# Patient Record
Sex: Female | Born: 1995 | Race: Black or African American | Hispanic: No | Marital: Single | Smoking: Never smoker
Health system: Southern US, Community
[De-identification: ages and names within clinical notes are randomized; demographics above are authoritative.]

## PROBLEM LIST (undated history)

## (undated) DIAGNOSIS — Z789 Other specified health status: Secondary | ICD-10-CM

## (undated) DIAGNOSIS — Z9889 Other specified postprocedural states: Secondary | ICD-10-CM

---
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
## 2017-04-21 DIAGNOSIS — N750 Cyst of Bartholin's gland: Secondary | ICD-10-CM | POA: Insufficient documentation

## 2017-09-01 ENCOUNTER — Emergency Department: Payer: BLUE CROSS/BLUE SHIELD

## 2017-09-01 ENCOUNTER — Emergency Department
Admission: EM | Admit: 2017-09-01 | Discharge: 2017-09-02 | Disposition: A | Discharge status: Home / Self Care | Payer: BLUE CROSS/BLUE SHIELD | Attending: Emergency Medicine | Admitting: Emergency Medicine

## 2017-09-01 ENCOUNTER — Encounter: Payer: Self-pay | Admitting: Emergency Medicine

## 2017-09-01 DIAGNOSIS — G5600 Carpal tunnel syndrome, unspecified upper limb: Secondary | ICD-10-CM

## 2017-09-01 DIAGNOSIS — G5601 Carpal tunnel syndrome, right upper limb: Secondary | ICD-10-CM | POA: Diagnosis not present

## 2017-09-01 DIAGNOSIS — M25531 Pain in right wrist: Secondary | ICD-10-CM | POA: Diagnosis present

## 2017-09-01 NOTE — ED Triage Notes (Signed)
Pt has right wrist pain for 2 weeks.  Pt states WC  Pt states she does repetittive use of right wrist at work.   No swelling noted.

## 2017-09-02 MED ORDER — IBUPROFEN 800 MG PO TABS: 800.0000 mg | ORAL_TABLET | Freq: Three times a day (TID) | ORAL | 0 refills | Status: DC | PRN

## 2017-09-02 NOTE — ED Notes (Signed)
Breath Analysis completed. Paperwork on chart.

## 2017-09-02 NOTE — ED Provider Notes (Signed)
[facility] Emergency Department Provider Note  time seen: 11:45 PM  I have reviewed the triage vital signs and the nursing notes.   HISTORY  Chief Complaint Wrist Pain    HPI FREDI HURTADO is a 21 y.o. female presents with nontraumatic right wrist pain2 weeks. Patient states that she does repetitive movements at work with her right wrist and believes this may be the reason for her discomfort. Patient states that the pain radiates into her hand from the thumb to the ring finger on the palmar aspect.Milus Mallick denies any neck injury.   Past medical history none There are no active problems to display for this patient.   past surgical history none  Prior to Admission medications   Not on File    Allergies no known drug allergies No family history on file.  Social History Social History  Substance Use Topics  . Smoking status: Not on file  . Smokeless tobacco: Not on file  . Alcohol use Not on file    Review of Systems Constitutional: No fever/chills Eyes: No visual changes. ENT: No sore throat. Cardiovascular: Denies chest pain. Respiratory: Denies shortness of breath. Gastrointestinal: No abdominal pain.  No nausea, no vomiting.  No diarrhea.  No constipation. Genitourinary: Negative for dysuria. Musculoskeletal: Negative for neck pain.  Negative for back pain.positive for right wrist pain Integumentary: Negative for rash. Neurological: Negative for headaches, focal weakness or numbness.   ____________________________________________   PHYSICAL EXAM:  VITAL SIGNS: ED Triage Vitals  Enc Vitals Group     BP 09/01/17 2239 139/75     Pulse Rate 09/01/17 2239 68     Resp 09/01/17 2239 18     Temp 09/01/17 2239 98.3 F (36.8 C)     Temp Source 09/01/17 2239 Oral     SpO2 09/01/17 2239 100 %     Weight 09/01/17 2240 69.4 kg (153 lb)     Height 09/01/17 2240 1.676 m ( )     Head Circumference --      Peak Flow --    Pain Score 09/01/17 2238 8     Pain Loc --      Pain Edu? --      Excl. in GC? --     Constitutional: Alert and oriented. Well appearing and in no acute distress. Eyes: Conjunctivae are normal. Head: Atraumatic. Mouth/Throat: Mucous membranes are moist. Oropharynx non-erythematous. Neck: No stridor.  Cardiovascular: Normal rate, regular rhythm. Good peripheral circulation. Grossly normal heart sounds. Respiratory: Normal respiratory effort.  No retractions. Lungs CTAB. Gastrointestinal: Soft and nontender. No distention.  Musculoskeletal: No lower extremity tenderness nor edema. No gross deformities of extremities. Neurologic:  Normal speech and language. No gross focal neurologic deficits are appreciated.  Skin:  Skin is warm, dry and intact. No rash noted. Psychiatric: Mood and affect are normal. Speech and behavior are normal.   RADIOLOGY I, Mora N Cloud Graham, personally viewed and evaluated these images (plain radiographs) as part of my medical decision making, as well as reviewing the written report by the radiologist.  Dg Wrist Complete Right  Result Date: 09/01/2017 CLINICAL DATA:  RIGHT wrist pain for 2 weeks. Possible repetitive stress injury. EXAM: RIGHT WRIST - COMPLETE 3+ VIEW COMPARISON:  None. FINDINGS: There is no evidence of fracture or dislocation. There is no evidence of arthropathy or other focal bone abnormality. Soft tissues are unremarkable. IMPRESSION: Negative. Electronically Signed   By: Awilda Metro M.D.   On: 09/01/2017 23:11  Procedures   ____________________________________________   INITIAL IMPRESSION / ASSESSMENT AND PLAN / ED COURSE  Pertinent labs & imaging results that were available during my care of the patient were reviewed by me and considered in my medical decision making (see chart for details).  history of physical exam concerning for possible median nerve compression and a such wrist splint applied. Patient given ibuprofen 800  mg will be prescribed same for home. Patient be referred to orthopedic surgery       ____________________________________________  FINAL CLINICAL IMPRESSION(S) / ED DIAGNOSES  Final diagnoses:  Median nerve compression     MEDICATIONS GIVEN DURING THIS VISIT:  Medications - No data to display   NEW OUTPATIENT MEDICATIONS STARTED DURING THIS VISIT:  New Prescriptions   No medications on file    Modified Medications   No medications on file    Discontinued Medications   No medications on file     Note:  This document was prepared using Dragon voice recognition software and may include unintentional dictation errors.    Darci CurrentBrown, [place] N, MD 09/02/17 [phone]

## 2017-09-02 NOTE — ED Notes (Signed)

## 2017-09-02 NOTE — ED Notes (Addendum)
Awaiting workers comp specs to be completed prior to d/c by available/certified tech

## 2017-09-02 NOTE — ED Notes (Signed)
ED Provider at bedside. 

## 2017-09-02 NOTE — ED Notes (Signed)
esig pad not working. Paper copy

## 2018-01-23 ENCOUNTER — Ambulatory Visit (HOSPITAL_COMMUNITY)
Admission: EM | Admit: 2018-01-23 | Discharge: 2018-01-23 | Disposition: A | Discharge status: Home / Self Care | Payer: Managed Care, Other (non HMO) | Attending: Internal Medicine | Admitting: Internal Medicine

## 2018-01-23 ENCOUNTER — Encounter (HOSPITAL_COMMUNITY): Payer: Self-pay | Admitting: Emergency Medicine

## 2018-01-23 DIAGNOSIS — L0291 Cutaneous abscess, unspecified: Secondary | ICD-10-CM | POA: Diagnosis not present

## 2018-01-23 MED ORDER — CEPHALEXIN 500 MG PO CAPS: 500.0000 mg | ORAL_CAPSULE | Freq: Four times a day (QID) | ORAL | 0 refills | Status: DC

## 2018-01-23 NOTE — ED Triage Notes (Signed)
PT C/O: abscess on vagina onset 3-4 days associated w/pain and swelling, drainage  Reports she tried to pop it w/her fingers.   DENIES: fevers  TAKING MEDS: Warm sitz baths  A&O x4... NAD... Ambulatory

## 2018-01-23 NOTE — ED Provider Notes (Signed)
[facility]    CSN: [id number] Arrival date & time: 01/23/18  1522     History   Chief Complaint Chief Complaint  Patient presents with  . Abscess    HPI Debra Cline is a 22 y.o. female.   22 year old female comes in for 3-4 day history of abscess on the vagina. States she used to get them very often, but has not had one for a year or two.  She has noticed increased swelling with some drainage.  Denies fever, chills, night sweats.  Has been doing warm sitz baths.  States her OB/GYN is not able to fit her in till May.       History reviewed. No pertinent past medical history.  There are no active problems to display for this patient.   History reviewed. No pertinent surgical history.  OB History    No data available       Home Medications    Prior to Admission medications   Medication Sig Start Date End Date Taking? Authorizing Provider  cephALEXin (KEFLEX) 500 MG capsule Take 1 capsule (500 mg total) by mouth 4 (four) times daily. 01/23/18   Cathie HoopsYu, Valyn Latchford V, PA-C  ibuprofen (ADVIL,MOTRIN) 800 MG tablet Take 1 tablet (800 mg total) by mouth every 8 (eight) hours as needed. 09/02/17   Darci CurrentBrown,  N, MD    Family History Family History  Family history unknown: Yes    Social History Social History   Tobacco Use  . Smoking status: Never Smoker  . Smokeless tobacco: Never Used  Substance Use Topics  . Alcohol use: Not on file  . Drug use: Not on file     Allergies   Mold extract [trichophyton] and Peanut-containing drug products   Review of Systems Review of Systems  Reason unable to perform ROS: See HPI as above.     Physical Exam Triage Vital Signs ED Triage Vitals  Enc Vitals Group     BP 01/23/18 1534 119/61     Pulse Rate 01/23/18 1534 66     Resp 01/23/18 1534 16     Temp 01/23/18 1534 98.5 F (36.9 C)     Temp Source 01/23/18 1534 Oral     SpO2 01/23/18 1534 100 %     Weight --      Height --      Head Circumference --       Peak Flow --      Pain Score 01/23/18 1535 6     Pain Loc --      Pain Edu? --      Excl. in GC? --    No data found.  Updated Vital Signs BP 119/61 (BP Location: Left Arm)   Pulse 66   Temp 98.5 F (36.9 C) (Oral)   Resp 16   SpO2 100%   Physical Exam  Constitutional: She is oriented to person, place, and time. She appears well-developed and well-nourished. No distress.  HENT:  Head: Normocephalic and atraumatic.  Eyes: Conjunctivae are normal. Pupils are equal, round, and reactive to light.  Genitourinary:  Genitourinary Comments: Bartholin gland abscess to the left that is self draining. Tenderness to palpation.   Neurological: She is alert and oriented to person, place, and time.    UC Treatments / Results  Labs (all labs ordered are listed, but only abnormal results are displayed) Labs Reviewed - No data to display  EKG  EKG Interpretation None  Radiology No results found.  Procedures Procedures (including critical care time)  Medications Ordered in UC Medications - No data to display   Initial Impression / Assessment and Plan / UC Course  I have reviewed the triage vital signs and the nursing notes.  Pertinent labs & imaging results that were available during my care of the patient were reviewed by me and considered in my medical decision making (see chart for details).    Given self drainage, will defer I&D for now.  Keflex as directed.  Continue warm compresses and sitz bath.  Follow-up with OB/GYN/PCP for further evaluation needed.  Information on woman's outpatient given as well.  Return precautions given.  Patient expresses understanding and agrees to plan.  Final Clinical Impressions(s) / UC Diagnoses   Final diagnoses:  Abscess    ED Discharge Orders        Ordered    cephALEXin (KEFLEX) 500 MG capsule  4 times daily     01/23/18 1649        Belinda Fisher, [place] 01/23/18 1655

## 2018-01-23 NOTE — Discharge Instructions (Signed)
Abscess is self draining. Will start keflex for now. Continue warm compresses. If OBGYN still unable to fit you in, follow up with PCP for recheck and possible drainage. I have also attached our OBGYN information in case they can fit you in sooner.

## 2018-03-10 ENCOUNTER — Emergency Department
Admission: EM | Admit: 2018-03-10 | Discharge: 2018-03-10 | Disposition: A | Discharge status: Home / Self Care | Payer: Managed Care, Other (non HMO) | Attending: Emergency Medicine | Admitting: Emergency Medicine

## 2018-03-10 ENCOUNTER — Emergency Department: Payer: Managed Care, Other (non HMO)

## 2018-03-10 ENCOUNTER — Other Ambulatory Visit: Payer: Self-pay

## 2018-03-10 DIAGNOSIS — R002 Palpitations: Secondary | ICD-10-CM | POA: Insufficient documentation

## 2018-03-10 DIAGNOSIS — R0789 Other chest pain: Secondary | ICD-10-CM | POA: Insufficient documentation

## 2018-03-10 DIAGNOSIS — E876 Hypokalemia: Secondary | ICD-10-CM | POA: Diagnosis not present

## 2018-03-10 DIAGNOSIS — R079 Chest pain, unspecified: Secondary | ICD-10-CM

## 2018-03-10 LAB — CBC
HCT: 39.8 % (ref 35.0–47.0)
Hemoglobin: 12.8 g/dL (ref 12.0–16.0)
MCH: 23.2 pg — ABNORMAL LOW (ref 26.0–34.0)
MCHC: 32.2 g/dL (ref 32.0–36.0)
MCV: 72 fL — ABNORMAL LOW (ref 80.0–100.0)
Platelets: 260 10*3/uL (ref 150–440)
RBC: 5.52 MIL/uL — ABNORMAL HIGH (ref 3.80–5.20)
RDW: 14 % (ref 11.5–14.5)
WBC: 7 10*3/uL (ref 3.6–11.0)

## 2018-03-10 LAB — BASIC METABOLIC PANEL
Anion gap: 8 (ref 5–15)
BUN: 10 mg/dL (ref 6–20)
CO2: 26 mmol/L (ref 22–32)
Calcium: 9.2 mg/dL (ref 8.9–10.3)
Chloride: 107 mmol/L (ref 101–111)
Creatinine, Ser: 0.71 mg/dL (ref 0.44–1.00)
GFR calc Af Amer: >60 mL/min (ref >60)
GFR calc non Af Amer: >60 mL/min (ref >60)
Glucose, Bld: 101 mg/dL — ABNORMAL HIGH (ref 65–99)
Potassium: 3.1 mmol/L — ABNORMAL LOW (ref 3.5–5.1)
Sodium: 141 mmol/L (ref 135–145)

## 2018-03-10 LAB — TROPONIN I: Troponin I: <0.03 ng/mL (ref <0.03)

## 2018-03-10 MED ORDER — POTASSIUM CHLORIDE CRYS ER 20 MEQ PO TBCR
40.0000 meq | EXTENDED_RELEASE_TABLET | Freq: Once | ORAL | Status: AC
  Administered 2018-03-10: 40 meq via ORAL

## 2018-03-10 MED ORDER — POTASSIUM CHLORIDE CRYS ER 20 MEQ PO TBCR
EXTENDED_RELEASE_TABLET | ORAL | Status: AC
  Filled 2018-03-10: qty 2

## 2018-03-10 NOTE — ED Notes (Signed)
First nurse note   Presents with weakness and chest pain

## 2018-03-10 NOTE — ED Provider Notes (Signed)
[facility] Emergency Department Provider Note  ____________________________________________  Time seen: Approximately 10:14 PM  I have reviewed the triage vital signs and the nursing notes.   HISTORY  Chief Complaint Chest Pain    HPI Debra Cline is a 22 y.o. female with a history of atypical chest pain and palpitations, anxiety of her medications, presenting with chest pain.  The patient reports that last year she had an episode where she became "paralyzed," and was evaluated at the hospital and with multiple outpatient cardiology visits without any clear findings other than "palpitations."Patient reports that yesterday, she was carrying a box at work when she developed a tightness sensation in the center of the chest.  The pain did not radiate and she did not have any associated diaphoresis, nausea or vomiting, palpitations, lightheadedness or fainting.  This pain was similar to the pain she has experienced multiple times since her extensive evaluation last year.  At this time, the patient is asymptomatic.  She denies any new symptoms including cough or cold symptoms, fever chills, LE swelling or calf pain.   History reviewed. No pertinent past medical history.  There are no active problems to display for this patient.   History reviewed. No pertinent surgical history.  Current Outpatient Rx  . Order #: [id number] Class: Normal  . Order #: [id number] Class: Print    Allergies Mold extract [trichophyton] and Peanut-containing drug products  Family History  Family history unknown: Yes    Social History Social History   Tobacco Use  . Smoking status: Never Smoker  . Smokeless tobacco: Never Used  Substance Use Topics  . Alcohol use: No    Frequency: Never  . Drug use: No    Review of Systems Constitutional: No fever/chills. Eyes: No visual changes. ENT: No sore throat. No congestion or rhinorrhea. Cardiovascular: Positive chest pain. Denies  palpitations. Respiratory: Denies shortness of breath.  No cough. Gastrointestinal: No abdominal pain.  No nausea, no vomiting.  No diarrhea.  No constipation. Genitourinary: Negative for dysuria. Musculoskeletal: Negative for back pain.  No lower extremity swelling or calf pain. Skin: Negative for rash. Neurological: Negative for headaches. No focal numbness, tingling or weakness.     ____________________________________________   PHYSICAL EXAM:  VITAL SIGNS: ED Triage Vitals  Enc Vitals Group     BP 03/10/18 1848 (!) 145/93     Pulse Rate 03/10/18 1848 83     Resp 03/10/18 1848 16     Temp 03/10/18 1848 98.5 F (36.9 C)     Temp Source 03/10/18 1848 Oral     SpO2 03/10/18 1848 100 %     Weight 03/10/18 1849 140 lb (63.5 kg)     Height 03/10/18 1849 5\' 6"  (1.676 m)     Head Circumference --      Peak Flow --      Pain Score 03/10/18 1852 8     Pain Loc --      Pain Edu? --      Excl. in GC? --     Constitutional: Alert and oriented. Well appearing and in no acute distress. Answers questions appropriately. Eyes: Conjunctivae are normal.  EOMI. No scleral icterus. Head: Atraumatic. Nose: No congestion/rhinnorhea. Mouth/Throat: Mucous membranes are moist.  Neck: No stridor.  Supple.  No JVD.  No meningismus. Cardiovascular: Normal rate, regular rhythm. No murmurs, rubs or gallops.  Respiratory: Normal respiratory effort.  No accessory muscle use or retractions. Lungs CTAB.  No wheezes, rales or ronchi. Gastrointestinal: Soft,  nontender and nondistended.  No guarding or rebound.  No peritoneal signs. Musculoskeletal: No LE edema. No ttp in the calves or palpable cords.  Negative Homan's sign. Neurologic:  A&Ox3.  Speech is clear.  Face and smile are symmetric.  EOMI.  Moves all extremities well. Skin:  Skin is warm, dry and intact. No rash noted. Psychiatric: Mood and affect are normal. Speech and behavior are normal.  Normal  judgement.  ____________________________________________   LABS (all labs ordered are listed, but only abnormal results are displayed)  Labs Reviewed  BASIC METABOLIC PANEL - Abnormal; Notable for the following components:      Result Value   Potassium 3.1 (*)    Glucose, Bld 101 (*)    All other components within normal limits  CBC - Abnormal; Notable for the following components:   RBC 5.52 (*)    MCV 72.0 (*)    MCH 23.2 (*)    All other components within normal limits  TROPONIN I  POC URINE PREG, ED   ____________________________________________  EKG  ED ECG REPORT I, Rockne MenghiniNorman, Anne-Caroline, the attending physician, personally viewed and interpreted this ECG.   Date: 03/10/2018  EKG Time: 1845  Rate: 85  Rhythm: normal sinus rhythm  Axis: normal  Intervals:none  ST&T Change: No STEMI  ____________________________________________  RADIOLOGY  Dg Chest 2 View  Result Date: 03/10/2018 CLINICAL DATA:  Left-sided chest pain EXAM: CHEST - 2 VIEW COMPARISON:  None. FINDINGS: The heart size and mediastinal contours are within normal limits. Both lungs are clear. The visualized skeletal structures are unremarkable. IMPRESSION: No active cardiopulmonary disease. Electronically Signed   By: Jasmine PangKim  Fujinaga M.D.   On: 03/10/2018 19:21    ____________________________________________   PROCEDURES  Procedure(s) performed: None  Procedures  Critical Care performed: No ____________________________________________   INITIAL IMPRESSION / ASSESSMENT AND PLAN / ED COURSE  Pertinent labs & imaging results that were available during my care of the patient were reviewed by me and considered in my medical decision making (see chart for details).  22 y.o. female presenting with chest pain that started yesterday and has now resolved.  Overall, the patient is hematin and medically stable and has no abnormal cardiopulmonary findings on my examination.  The etiology of her chest pain  is unclear but her workup in the emergency department is reassuring.  Her EKG does not show any ischemic changes and her troponin is negative.  Her chest x-ray does not show any acute cardiopulmonary process and she does not have any symptoms that would be concerning for infection.  A GI cause including esophageal spasm or reflux is possible but again this is less likely.  The patient has been off of her anxiety medications, self discontinued, for several months and some of the symptoms could be related to anxiety although she states she is not particularly stressed or feeling anxious.  At this time, the patient is safe for discharge and I have encouraged her to follow-up closely with her primary care physician.  Return precautions were discussed  ____________________________________________  FINAL CLINICAL IMPRESSION(S) / ED DIAGNOSES  Final diagnoses:  Nonspecific chest pain  Hypokalemia         NEW MEDICATIONS STARTED DURING THIS VISIT:  New Prescriptions   No medications on file      Rockne MenghiniNorman, Anne-Caroline, MD 03/10/18 2237

## 2018-03-10 NOTE — ED Triage Notes (Signed)
Pt states L sided CP that began yesterday around 4pm. States it's like a tightness. States she was walking when pain started. States when she bent down she would get dizzy. States she just kept getting worse. Denies cough or congestion.   Alert, oriented, ambulatory, no distress noted.

## 2018-03-10 NOTE — Discharge Instructions (Addendum)
These return to the emergency department if you develop severe chest pain, lightheadedness or fainting, shortness of breath, fever or chills, or any other symptoms concerning to you.  Please make a follow-up appointment with your primary care physician including to have your potassium rechecked.

## 2018-03-10 NOTE — ED Notes (Signed)
Pt ambulatory to POV without difficulty. VSS. NAD. Discharge instruction, and follow up discussed. All questions answered.

## 2019-12-09 ENCOUNTER — Emergency Department: Payer: Managed Care, Other (non HMO)

## 2019-12-09 ENCOUNTER — Encounter: Payer: Self-pay | Admitting: *Deleted

## 2019-12-09 ENCOUNTER — Other Ambulatory Visit: Payer: Self-pay

## 2019-12-09 DIAGNOSIS — Z91010 Allergy to peanuts: Secondary | ICD-10-CM | POA: Insufficient documentation

## 2019-12-09 DIAGNOSIS — R0789 Other chest pain: Secondary | ICD-10-CM | POA: Diagnosis not present

## 2019-12-09 LAB — CBC
HCT: 39.5 % (ref 36.0–46.0)
Hemoglobin: 12.6 g/dL (ref 12.0–15.0)
MCH: 24 pg — ABNORMAL LOW (ref 26.0–34.0)
MCHC: 31.9 g/dL (ref 30.0–36.0)
MCV: 75.1 fL — ABNORMAL LOW (ref 80.0–100.0)
Platelets: 300 10*3/uL (ref 150–400)
RBC: 5.26 MIL/uL — ABNORMAL HIGH (ref 3.87–5.11)
RDW: 13.2 % (ref 11.5–15.5)
WBC: 8.9 10*3/uL (ref 4.0–10.5)
nRBC: 0 % (ref 0.0–0.2)

## 2019-12-09 LAB — BASIC METABOLIC PANEL
Anion gap: 11 (ref 5–15)
BUN: 10 mg/dL (ref 6–20)
CO2: 22 mmol/L (ref 22–32)
Calcium: 9.1 mg/dL (ref 8.9–10.3)
Chloride: 104 mmol/L (ref 98–111)
Creatinine, Ser: 0.64 mg/dL (ref 0.44–1.00)
GFR calc Af Amer: >60 mL/min (ref >60)
GFR calc non Af Amer: >60 mL/min (ref >60)
Glucose, Bld: 102 mg/dL — ABNORMAL HIGH (ref 70–99)
Potassium: 4 mmol/L (ref 3.5–5.1)
Sodium: 137 mmol/L (ref 135–145)

## 2019-12-09 LAB — TROPONIN I (HIGH SENSITIVITY): Troponin I (High Sensitivity): <2 ng/L (ref <18)

## 2019-12-09 LAB — POCT PREGNANCY, URINE: Preg Test, Ur: NEGATIVE

## 2019-12-09 IMAGING — CR DG CHEST 2V
2 series · 2 of 2 positions shown · non-contrast
Comparison: Radiograph [DATE]

CLINICAL DATA: Chest pain

EXAM:
CHEST - 2 VIEW

[chest pa]
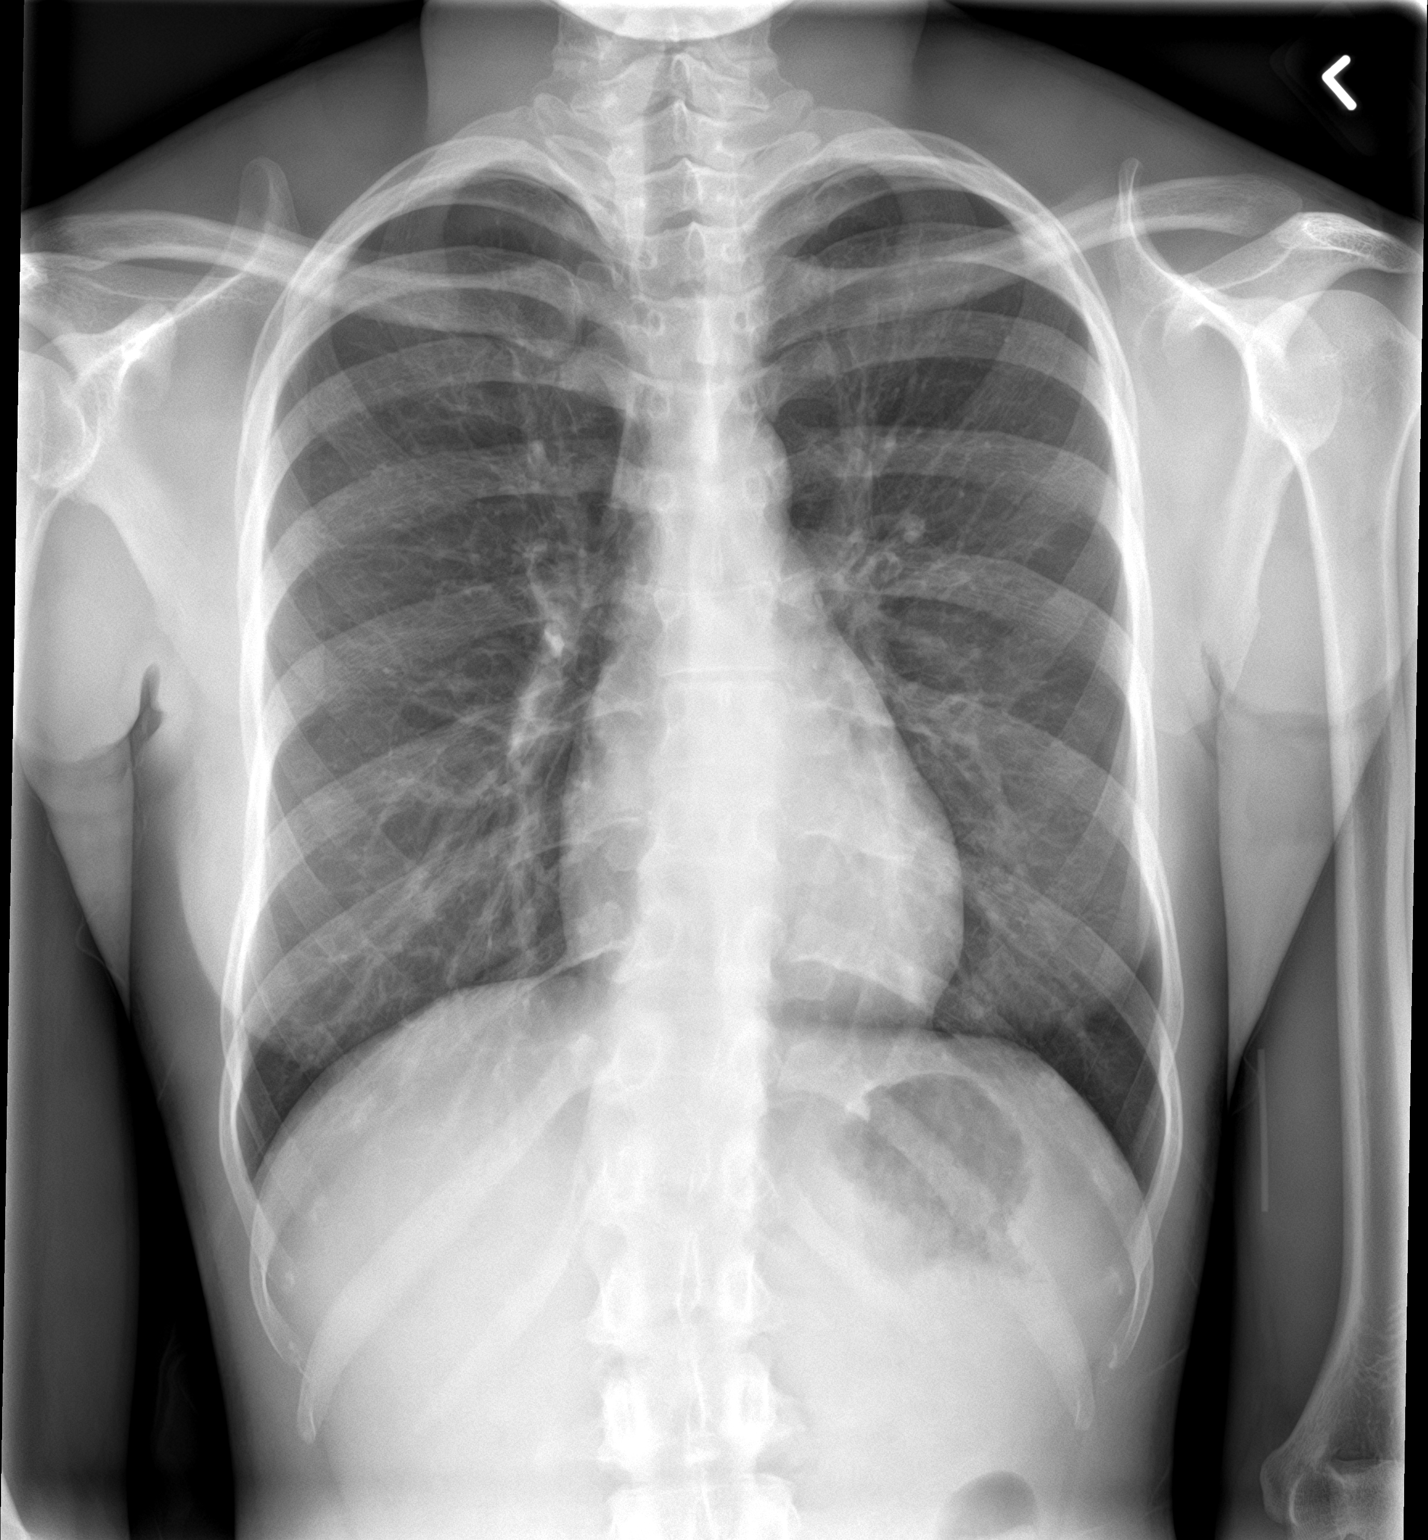

[chest lat]
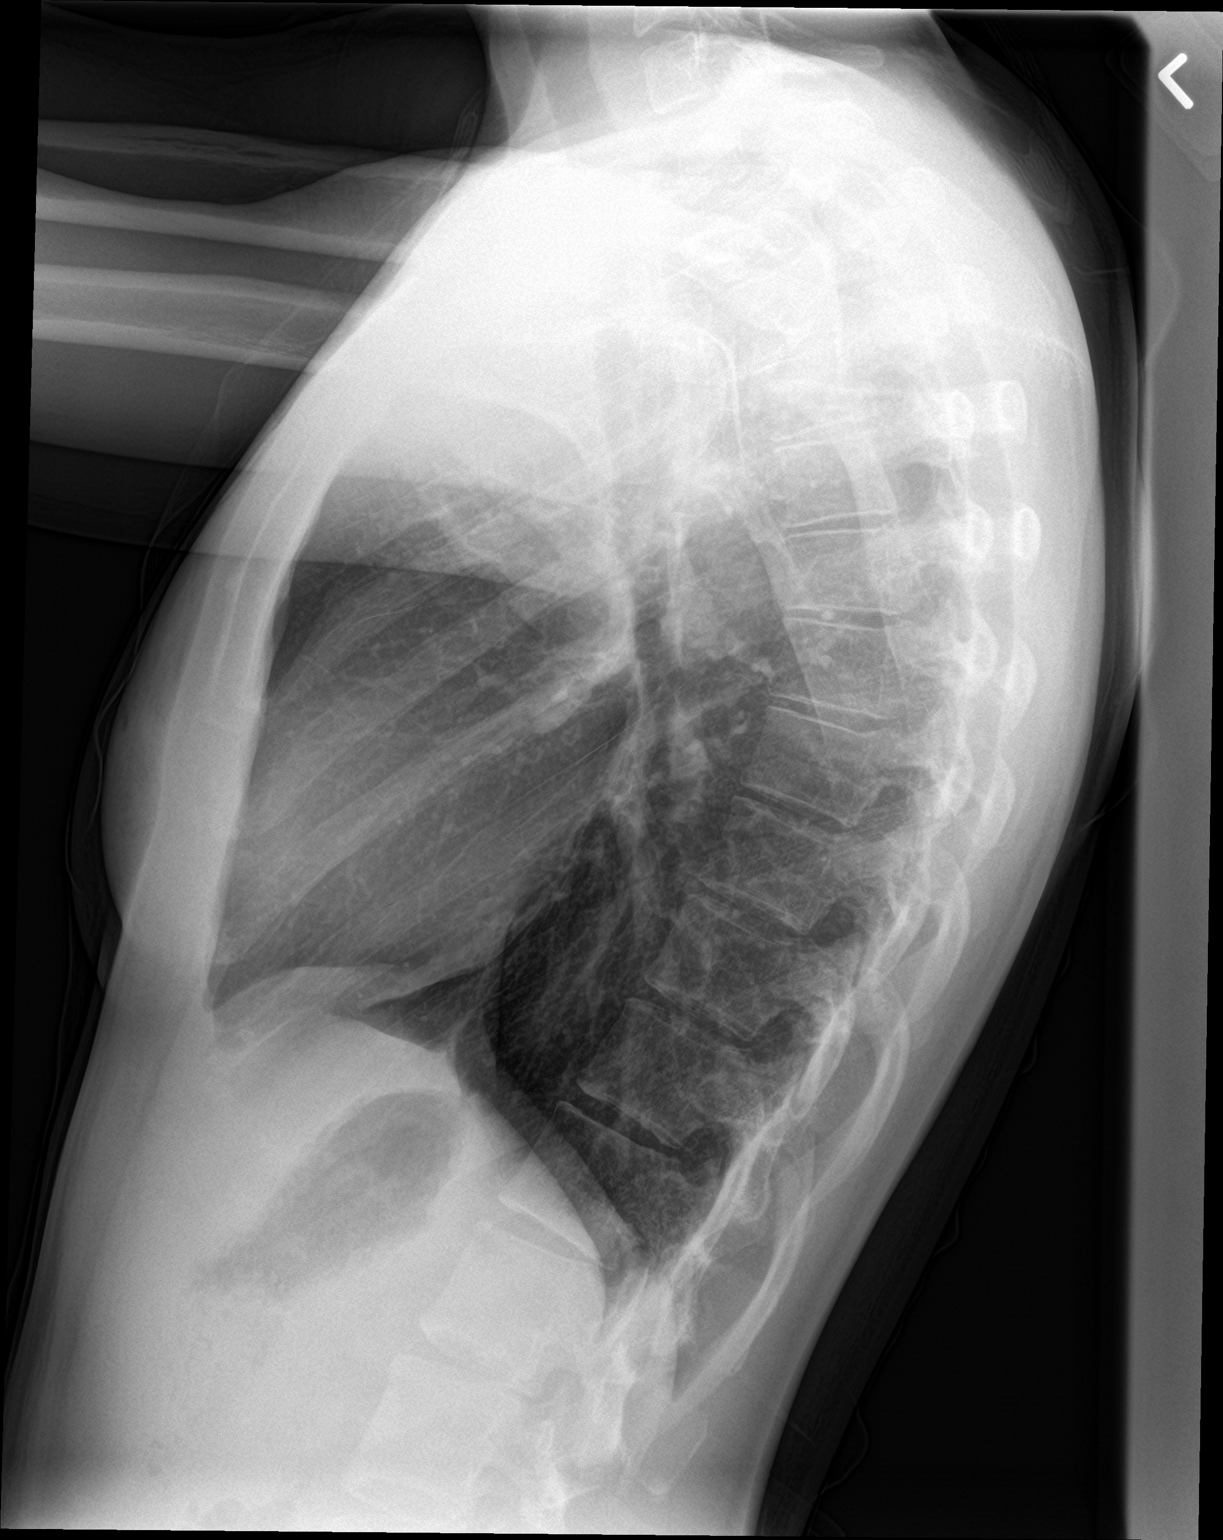

[2 of 2 positions shown; findings below may reference images not displayed]

FINDINGS: No consolidation, features of edema, pneumothorax, or effusion.
Pulmonary vascularity is normally distributed. The cardiomediastinal
contours are unremarkable. No acute osseous or soft tissue
abnormality.
IMPRESSION: No acute cardiopulmonary abnormality.

## 2019-12-09 MED ORDER — SODIUM CHLORIDE 0.9% FLUSH: 3.0000 mL | Freq: Once | INTRAVENOUS | Status: DC

## 2019-12-09 NOTE — ED Triage Notes (Signed)
Pt to triage via wheelchair.  Pt reports chest pain and left arm pain.  Sx since this am, worse this evening.  No cough.  No sob.  Pt reports nausea.  Pt alert  Speech clear.

## 2019-12-09 NOTE — ED Notes (Signed)
poct pregnancy Negative 

## 2019-12-10 ENCOUNTER — Emergency Department
Admission: EM | Admit: 2019-12-10 | Discharge: 2019-12-10 | Disposition: A | Discharge status: Home / Self Care | Payer: Managed Care, Other (non HMO) | Attending: Emergency Medicine | Admitting: Emergency Medicine

## 2019-12-10 ENCOUNTER — Emergency Department: Payer: Managed Care, Other (non HMO)

## 2019-12-10 DIAGNOSIS — R079 Chest pain, unspecified: Secondary | ICD-10-CM

## 2019-12-10 LAB — HEPATIC FUNCTION PANEL
ALT: 13 U/L (ref 0–44)
AST: 17 U/L (ref 15–41)
Albumin: 4.1 g/dL (ref 3.5–5.0)
Alkaline Phosphatase: 63 U/L (ref 38–126)
Bilirubin, Direct: 0.1 mg/dL (ref 0.0–0.2)
Indirect Bilirubin: 0.7 mg/dL (ref 0.3–0.9)
Total Bilirubin: 0.8 mg/dL (ref 0.3–1.2)
Total Protein: 7.1 g/dL (ref 6.5–8.1)

## 2019-12-10 LAB — LIPASE, BLOOD: Lipase: 24 U/L (ref 11–51)

## 2019-12-10 LAB — TROPONIN I (HIGH SENSITIVITY): Troponin I (High Sensitivity): <2 ng/L (ref <18)

## 2019-12-10 MED ORDER — KETOROLAC TROMETHAMINE 30 MG/ML IJ SOLN
10.0000 mg | Freq: Once | INTRAMUSCULAR | Status: AC
  Administered 2019-12-10: 9.9 mg via INTRAVENOUS
  Filled 2019-12-10: qty 1

## 2019-12-10 MED ORDER — IOHEXOL 350 MG/ML SOLN
75.0000 mL | Freq: Once | INTRAVENOUS | Status: AC | PRN
  Administered 2019-12-10: 75 mL via INTRAVENOUS

## 2019-12-10 MED ORDER — SODIUM CHLORIDE 0.9 % IV BOLUS
1000.0000 mL | Freq: Once | INTRAVENOUS | Status: AC
  Administered 2019-12-10: 1000 mL via INTRAVENOUS

## 2019-12-10 MED ORDER — FAMOTIDINE IN NACL 20-0.9 MG/50ML-% IV SOLN
20.0000 mg | Freq: Once | INTRAVENOUS | Status: AC
  Administered 2019-12-10: 20 mg via INTRAVENOUS
  Filled 2019-12-10: qty 50

## 2019-12-10 NOTE — ED Provider Notes (Signed)
[facility] Emergency Department Provider Note   ____________________________________________   First MD Initiated Contact with Patient 12/10/19 0202     (approximate)  I have reviewed the triage vital signs and the nursing notes.   HISTORY  Chief Complaint Chest Pain    HPI Debra Cline is a 23 y.o. female who presents to the ED from home with a chief complaint of chest pain.  Patient reports she was followed by cardiology 2 years ago for chest pain.  States they had her on nitroglycerin, she wore a Holter monitor which was fine.  She was eventually dismissed from cardiology.  Has had intermittent chest pain for the better part of the last 2 years.  However, today she reports her pain was sharp in nature along with burning and radiated to her left arm.  Complains associated with nausea.  Patient works at [employer] and several employees have tested positive.  She has a Covid 19 test pending which was done by her PCP yesterday.  Denies fever, shortness of breath, abdominal pain, vomiting, diarrhea.  Does take OCPs.       Past medical history Chest pain  There are no problems to display for this patient.   No past surgical history on file.  Prior to Admission medications   Medication Sig Start Date End Date Taking? Authorizing Provider  cephALEXin (KEFLEX) 500 MG capsule Take 1 capsule (500 mg total) by mouth 4 (four) times daily. 01/23/18   Cathie Hoops, Amy V, PA-C  ibuprofen (ADVIL,MOTRIN) 800 MG tablet Take 1 tablet (800 mg total) by mouth every 8 (eight) hours as needed. 09/02/17   Darci Current, MD    Allergies Mold extract [trichophyton] and Peanut-containing drug products  Family History  Family history unknown: Yes    Social History Social History   Tobacco Use  . Smoking status: Never Smoker  . Smokeless tobacco: Never Used  Substance Use Topics  . Alcohol use: No  . Drug use: No    Review of Systems  Constitutional: No  fever/chills Eyes: No visual changes. ENT: No sore throat. Cardiovascular: Positive for chest pain. Respiratory: Denies shortness of breath. Gastrointestinal: No abdominal pain.  Positive for nausea, no vomiting.  No diarrhea.  No constipation. Genitourinary: Negative for dysuria. Musculoskeletal: Negative for back pain. Skin: Negative for rash. Neurological: Negative for headaches, focal weakness or numbness.   ____________________________________________   PHYSICAL EXAM:  VITAL SIGNS: ED Triage Vitals  Enc Vitals Group     BP 12/09/19 2044 137/84     Pulse Rate 12/09/19 2042 80     Resp 12/09/19 2042 18     Temp 12/09/19 2042 98.6 F (37 C)     Temp Source 12/09/19 2042 Oral     SpO2 12/09/19 2042 100 %     Weight 12/09/19 2044 133 lb (60.3 kg)     Height 12/09/19 2044 5\' 6"  (1.676 m)     Head Circumference --      Peak Flow --      Pain Score 12/09/19 2044 9     Pain Loc --      Pain Edu? --      Excl. in GC? --     Constitutional: Alert and oriented. Well appearing and in no acute distress. Eyes: Conjunctivae are normal. PERRL. EOMI. Head: Atraumatic. Nose: No congestion/rhinnorhea. Mouth/Throat: Mucous membranes are moist.  Oropharynx non-erythematous. Neck: No stridor.   Cardiovascular: Normal rate, regular rhythm. Grossly normal heart sounds.  Good  peripheral circulation. Respiratory: Normal respiratory effort.  No retractions. Lungs CTAB. Gastrointestinal: Soft and nontender. No distention. No abdominal bruits. No CVA tenderness. Musculoskeletal: No lower extremity tenderness nor edema.  No joint effusions. Neurologic:  Normal speech and language. No gross focal neurologic deficits are appreciated. No gait instability. Skin:  Skin is warm, dry and intact. No rash noted. Psychiatric: Mood and affect are normal. Speech and behavior are normal.  ____________________________________________   LABS (all labs ordered are listed, but only abnormal results are  displayed)  Labs Reviewed  BASIC METABOLIC PANEL - Abnormal; Notable for the following components:      Result Value   Glucose, Bld 102 (*)    All other components within normal limits  CBC - Abnormal; Notable for the following components:   RBC 5.26 (*)    MCV 75.1 (*)    MCH 24.0 (*)    All other components within normal limits  HEPATIC FUNCTION PANEL  LIPASE, BLOOD  POC URINE PREG, ED  POCT PREGNANCY, URINE  TROPONIN I (HIGH SENSITIVITY)  TROPONIN I (HIGH SENSITIVITY)   ____________________________________________  EKG  ED ECG REPORT I, Zalyn Amend J, the attending physician, personally viewed and interpreted this ECG.   Date: 12/10/2019  EKG Time: 2048  Rate: 89  Rhythm: normal EKG, normal sinus rhythm  Axis: Normal  Intervals:none  ST&T Change: Nonspecific  ____________________________________________  RADIOLOGY  ED MD interpretation: No acute cardiopulmonary process; no PE  Official radiology report(s): DG Chest 2 View  Result Date: 12/09/2019 CLINICAL DATA:  Chest pain EXAM: CHEST - 2 VIEW COMPARISON:  Radiograph 03/10/2018 FINDINGS: No consolidation, features of edema, pneumothorax, or effusion. Pulmonary vascularity is normally distributed. The cardiomediastinal contours are unremarkable. No acute osseous or soft tissue abnormality. IMPRESSION: No acute cardiopulmonary abnormality. Electronically Signed   By: Lovena Le M.D.   On: 12/09/2019 21:05   CT Angio Chest PE W/Cm &/Or Wo Cm  Result Date: 12/10/2019 CLINICAL DATA:  Initial evaluation for acute sharp chest pain with left arm pain. EXAM: CT ANGIOGRAPHY CHEST WITH CONTRAST TECHNIQUE: Multidetector CT imaging of the chest was performed using the standard protocol during bolus administration of intravenous contrast. Multiplanar CT image reconstructions and MIPs were obtained to evaluate the vascular anatomy. CONTRAST:  32mL OMNIPAQUE IOHEXOL 350 MG/ML SOLN COMPARISON:  Prior radiograph from 12/09/2019.  FINDINGS: Cardiovascular: Normal intravascular enhancement seen throughout the intrathoracic aorta. No aneurysm or other abnormality. Visualized great vessels intact and normal. Heart size within normal limits. No pericardial effusion. Pulmonary arterial tree adequately opacified for evaluation. Main pulmonary artery within normal limits for caliber. No filling defect to suggest acute pulmonary embolism. Re-formatted imaging confirms these findings. Mediastinum/Nodes: Thyroid within normal limits. No pathologically enlarged mediastinal, hilar, or axillary lymph nodes. Soft tissue density within the anterior mediastinum most consistent with normal residual thymic tissue. Esophagus within normal limits. Lungs/Pleura: Tracheobronchial tree intact and patent. Lungs well inflated without focal infiltrate, edema, or effusion. No pneumothorax. No worrisome pulmonary nodule or mass. Upper Abdomen: Visualized upper abdomen demonstrates no acute or significant finding. Musculoskeletal: Visualized osseous structures within normal limits. No discrete or worrisome osseous lesions. Review of the MIP images confirms the above findings. IMPRESSION: 1. No CT evidence for acute pulmonary embolism. 2. No other acute cardiopulmonary abnormality identified. Electronically Signed   By: Jeannine Boga M.D.   On: 12/10/2019 03:49    ____________________________________________   PROCEDURES  Procedure(s) performed (including Critical Care):  Procedures   ____________________________________________   INITIAL IMPRESSION / ASSESSMENT AND  PLAN / ED COURSE  As part of my medical decision making, I reviewed the following data within the electronic MEDICAL RECORD NUMBER Nursing notes reviewed and incorporated, Labs reviewed, EKG interpreted, Old chart reviewed, Radiograph reviewed and Notes from prior ED visits     Ruthe MannanLaquisha D Estis was evaluated in Emergency Department on 12/10/2019 for the symptoms described in the history  of present illness. She was evaluated in the context of the global COVID-19 pandemic, which necessitated consideration that the patient might be at risk for infection with the SARS-CoV-2 virus that causes COVID-19. Institutional protocols and algorithms that pertain to the evaluation of patients at risk for COVID-19 are in a state of rapid change based on information released by regulatory bodies including the CDC and federal and state organizations. These policies and algorithms were followed during the patient's care in the ED.    23 year old female who presents with chest pain. Differential diagnosis includes, but is not limited to, ACS, aortic dissection, pulmonary embolism, cardiac tamponade, pneumothorax, pneumonia, pericarditis, myocarditis, GI-related causes including esophagitis/gastritis, and musculoskeletal chest wall pain.    I could not find records of patient's cardiology follow-ups, probably because she followed in [place].  PCP is also neurology but she lives in [place].  Initial troponin and EKG unremarkable.  Will check LFTs, lipase, repeat troponin.  CT chest to evaluate for PE as she is on OCPs.  She has a COVID-19 swab pending by her PCP.  If work-up is unremarkable, then anticipate patient may be discharged home with follow-up with local cardiology.   Clinical Course as of Dec 10 427  Fri Dec 10, 2019  [id number] Updated patient on CT result.  Will refer to local cardiology.  Strict return precautions given.  Patient verbalizes understanding and agrees with plan of care.   [JS]    Clinical Course User Index [JS] Irean HongSung, Kendyl Festa J, MD     ____________________________________________   FINAL CLINICAL IMPRESSION(S) / ED DIAGNOSES  Final diagnoses:  Nonspecific chest pain     ED Discharge Orders    None       Note:  This document was prepared using Dragon voice recognition software and may include unintentional dictation errors.   Irean HongSung, Dorene Bruni J, MD 12/10/19 [phone]

## 2019-12-10 NOTE — Discharge Instructions (Addendum)
Return to the ER for worsening symptoms, persistent vomiting, difficulty breathing or other concerns. °

## 2020-03-04 ENCOUNTER — Other Ambulatory Visit: Payer: Self-pay

## 2020-03-04 ENCOUNTER — Ambulatory Visit: Payer: Managed Care, Other (non HMO) | Attending: Internal Medicine

## 2020-03-04 DIAGNOSIS — Z23 Encounter for immunization: Secondary | ICD-10-CM

## 2020-03-04 NOTE — Progress Notes (Signed)
   Covid-19 Vaccination Clinic  Name:  GEORGANNE SIPLE    MRN: [id number] DOB: 07-07-1996  03/04/2020  Ms. Nguyen was observed post Covid-19 immunization for 15 minutes without incident. She was provided with Vaccine Information Sheet and instruction to access the V-Safe system.   Ms. Brownstein was instructed to call 911 with any severe reactions post vaccine: Marland Kitchen Difficulty breathing  . Swelling of face and throat  . A fast heartbeat  . A bad rash all over body  . Dizziness and weakness   Immunizations Administered    Name Date Dose VIS Date Route   Pfizer COVID-19 Vaccine 03/04/2020 11:36 AM 0.3 mL 12/03/2019 Intramuscular   Manufacturer: [employer], [employer]   Lot: [id number]   NDC: [address]-7

## 2020-04-04 ENCOUNTER — Ambulatory Visit: Payer: Managed Care, Other (non HMO)

## 2020-04-05 ENCOUNTER — Ambulatory Visit: Payer: Managed Care, Other (non HMO)

## 2020-04-14 ENCOUNTER — Ambulatory Visit: Payer: Managed Care, Other (non HMO)

## 2020-04-17 ENCOUNTER — Ambulatory Visit: Payer: Managed Care, Other (non HMO) | Attending: Internal Medicine

## 2020-04-17 DIAGNOSIS — Z23 Encounter for immunization: Secondary | ICD-10-CM

## 2020-04-17 NOTE — Progress Notes (Signed)
   Covid-19 Vaccination Clinic  Name:  ALIVEAH GALLANT    MRN: [id number] DOB: 1996-12-05  04/17/2020  Ms. Childers was observed post Covid-19 immunization for 15 minutes without incident. She was provided with Vaccine Information Sheet and instruction to access the V-Safe system.   Ms. Deschler was instructed to call 911 with any severe reactions post vaccine: Marland Kitchen Difficulty breathing  . Swelling of face and throat  . A fast heartbeat  . A bad rash all over body  . Dizziness and weakness   Immunizations Administered    Name Date Dose VIS Date Route   Pfizer COVID-19 Vaccine 04/17/2020  8:57 AM 0.3 mL 02/16/2019 Intramuscular   Manufacturer: [employer], [employer]   Lot: [id number]   NDC: [address]-6

## 2020-07-27 ENCOUNTER — Other Ambulatory Visit: Payer: Self-pay

## 2020-07-27 ENCOUNTER — Emergency Department
Admission: EM | Admit: 2020-07-27 | Discharge: 2020-07-27 | Disposition: A | Discharge status: Home / Self Care | Payer: Managed Care, Other (non HMO) | Attending: Emergency Medicine | Admitting: Emergency Medicine

## 2020-07-27 DIAGNOSIS — Z91010 Allergy to peanuts: Secondary | ICD-10-CM | POA: Insufficient documentation

## 2020-07-27 DIAGNOSIS — N750 Cyst of Bartholin's gland: Secondary | ICD-10-CM | POA: Insufficient documentation

## 2020-07-27 MED ORDER — SULFAMETHOXAZOLE-TRIMETHOPRIM 800-160 MG PO TABS: 1.0000 | ORAL_TABLET | Freq: Two times a day (BID) | ORAL | 0 refills | Status: AC

## 2020-07-27 MED ORDER — CEFTRIAXONE SODIUM 1 G IJ SOLR
1.0000 g | Freq: Once | INTRAMUSCULAR | Status: AC
  Administered 2020-07-27: 1 g via INTRAMUSCULAR
  Filled 2020-07-27: qty 10

## 2020-07-27 MED ORDER — FENTANYL CITRATE (PF) 100 MCG/2ML IJ SOLN
50.0000 ug | Freq: Once | INTRAMUSCULAR | Status: AC
  Administered 2020-07-27: 50 ug via INTRAMUSCULAR
  Filled 2020-07-27: qty 2

## 2020-07-27 MED ORDER — CEPHALEXIN 500 MG PO CAPS: 500.0000 mg | ORAL_CAPSULE | Freq: Three times a day (TID) | ORAL | 0 refills | Status: AC

## 2020-07-27 NOTE — ED Provider Notes (Signed)
Emergency Department Provider Note  ____________________________________________  Time seen: Approximately 6:06 PM  I have reviewed the triage vital signs and the nursing notes.   HISTORY  Chief Complaint Abscess   Historian Patient     HPI Debra Cline is a 24 y.o. female presents to the emergency department with a spontaneously draining Bartholin cyst.  Patient states that she has an extensive history of vaginal abscesses but has never been diagnosed with a Bartholin cyst in the past.  She denies fever and chills.  Pain is worse with attempted ambulation and improved with rest.   History reviewed. No pertinent past medical history.   Immunizations up to date:  Yes.     History reviewed. No pertinent past medical history.  There are no problems to display for this patient.   History reviewed. No pertinent surgical history.  Prior to Admission medications   Medication Sig Start Date End Date Taking? Authorizing Provider  albuterol (VENTOLIN HFA) 108 (90 Base) MCG/ACT inhaler Inhale 2 puffs into the lungs every 6 (six) hours. 04/12/20   [provider]  cephALEXin (KEFLEX) 500 MG capsule Take 1 capsule (500 mg total) by mouth 3 (three) times daily for 7 days. 07/27/20 08/03/20  Orvil Feil, PA-C  EPINEPHrine 0.3 mg/0.3 mL IJ SOAJ injection Inject 0.3 mg into the muscle once.    [provider]  escitalopram (LEXAPRO) 10 MG tablet Take 10 mg by mouth daily. 07/26/20   [provider]  sulfamethoxazole-trimethoprim (BACTRIM DS) 800-160 MG tablet Take 1 tablet by mouth 2 (two) times daily for 7 days. 07/27/20 08/03/20  Orvil Feil, PA-C    Allergies Mold extract [trichophyton] and Peanut-containing drug products  Family History  Family history unknown: Yes    Social History Social History   Tobacco Use  . Smoking status: Never Smoker  . Smokeless tobacco: Never Used  Substance Use Topics  . Alcohol use: No  . Drug use: No      Review of Systems  Constitutional: No fever/chills Eyes:  No discharge ENT: No upper respiratory complaints. Respiratory: no cough. No SOB/ use of accessory muscles to breath Gastrointestinal:   No nausea, no vomiting.  No diarrhea.  No constipation. Genitourinary: Patient has spontaneously draining abscess.   Musculoskeletal: Negative for musculoskeletal pain. Skin: Negative for rash, abrasions, lacerations, ecchymosis.    ____________________________________________   PHYSICAL EXAM:  VITAL SIGNS: ED Triage Vitals  Enc Vitals Group     BP 07/27/20 1543 (!) 127/99     Pulse Rate 07/27/20 1543 78     Resp 07/27/20 1543 18     Temp 07/27/20 1543 99 F (37.2 C)     Temp Source 07/27/20 1543 Oral     SpO2 07/27/20 1543 100 %     Weight 07/27/20 1544 140 lb (63.5 kg)     Height 07/27/20 1544 5\' 6"  (1.676 m)     Head Circumference --      Peak Flow --      Pain Score 07/27/20 1544 10     Pain Loc --      Pain Edu? --      Excl. in GC? --      Constitutional: Alert and oriented. Well appearing and in no acute distress. Eyes: Conjunctivae are normal. PERRL. EOMI. Head: Atraumatic. Cardiovascular: Normal rate, regular rhythm. Normal S1 and S2.  Good peripheral circulation. Respiratory: Normal respiratory effort without tachypnea or retractions. Lungs CTAB. Good air entry to the bases with no  decreased or absent breath sounds Gastrointestinal: Bowel sounds x 4 quadrants. Soft and nontender to palpation. No guarding or rigidity. No distention. Genitourinary: Patient has a 2 cm x 2 cm spontaneously draining Bartholin cyst on the left. Musculoskeletal: Full range of motion to all extremities. No obvious deformities noted Neurologic:  Normal for age. No gross focal neurologic deficits are appreciated.  Skin:  Skin is warm, dry and intact. No rash noted. Psychiatric: Mood and affect are normal for age. Speech and behavior are normal.    ____________________________________________   LABS (all labs ordered are listed, but only abnormal results are displayed)  Labs Reviewed - No data to display ____________________________________________  EKG   ____________________________________________  RADIOLOGY   No results found.  ____________________________________________    PROCEDURES  Procedure(s) performed:     Procedures     Medications  fentaNYL (SUBLIMAZE) injection 50 mcg (50 mcg Intramuscular Given 07/27/20 1818)  cefTRIAXone (ROCEPHIN) injection 1 g (1 g Intramuscular Given 07/27/20 2037)     ____________________________________________   INITIAL IMPRESSION / ASSESSMENT AND PLAN / ED COURSE  Pertinent labs & imaging results that were available during my care of the patient were reviewed by me and considered in my medical decision making (see chart for details).      Assessment and Plan:  Bartholin cyst 25 year old female presents to the emergency department with a spontaneously draining Bartholin cyst on the left.  A copious amount of purulent exudate was expressed from drainage site during this emergency department encounter.  I recommended making a follow-up appointment with OB/GYN.  Patient was given an injection of Rocephin in the emergency department and she was discharged with both Bactrim and Keflex.  Return precautions were given to return with new or worsening symptoms.  All patient questions were answered.  ____________________________________________  FINAL CLINICAL IMPRESSION(S) / ED DIAGNOSES  Final diagnoses:  Bartholin cyst      NEW MEDICATIONS STARTED DURING THIS VISIT:  ED Discharge Orders         Ordered    sulfamethoxazole-trimethoprim (BACTRIM DS) 800-160 MG tablet  2 times daily     Discontinue  Reprint     07/27/20 2041    cephALEXin (KEFLEX) 500 MG capsule  3 times daily     Discontinue  Reprint     07/27/20 2041              This chart was  dictated using voice recognition software/Dragon. Despite best efforts to proofread, errors can occur which can change the meaning. Any change was purely unintentional.     Gasper Lloyd 07/27/20 2046    Phineas Semen, MD 07/27/20 2216

## 2020-07-27 NOTE — ED Triage Notes (Signed)
Pt arrives via POV for reports of an abscess in vaginal area. Pt reports she saw PCP who told her to see gynecologist but patient states she is in too much pain to wait for gyn to call her. PT ambulatory from triage with slow gait, pt slow to sit down on bench. Pt is in NAD, A&Ox4, skin warm and dry.

## 2020-07-27 NOTE — Discharge Instructions (Signed)
Take Bactrim twice daily for the next seven days. Take Keflex three times daily for the next seven days.

## 2020-07-27 NOTE — ED Notes (Signed)
Refer to triage note: pt st pain is a 10/10. NAD noted upon assessment.

## 2021-03-01 ENCOUNTER — Ambulatory Visit: Payer: Self-pay

## 2021-08-10 ENCOUNTER — Other Ambulatory Visit: Payer: Self-pay | Admitting: Obstetrics & Gynecology

## 2021-08-14 ENCOUNTER — Encounter (HOSPITAL_BASED_OUTPATIENT_CLINIC_OR_DEPARTMENT_OTHER): Admission: RE | Payer: Self-pay | Source: Home / Self Care

## 2021-08-14 ENCOUNTER — Ambulatory Visit (HOSPITAL_BASED_OUTPATIENT_CLINIC_OR_DEPARTMENT_OTHER)
Admission: RE | Admit: 2021-08-14 | Payer: No Typology Code available for payment source | Source: Home / Self Care | Admitting: Obstetrics & Gynecology

## 2021-08-14 SURGERY — EXCISION, BARTHOLIN'S GLAND
Anesthesia: Choice

## 2021-12-22 ENCOUNTER — Telehealth: Payer: Managed Care, Other (non HMO)

## 2021-12-22 ENCOUNTER — Ambulatory Visit
Admission: EM | Admit: 2021-12-22 | Discharge: 2021-12-22 | Disposition: A | Discharge status: Home / Self Care | Payer: No Typology Code available for payment source | Attending: Emergency Medicine | Admitting: Emergency Medicine

## 2021-12-22 ENCOUNTER — Other Ambulatory Visit: Payer: Self-pay

## 2021-12-22 DIAGNOSIS — R051 Acute cough: Secondary | ICD-10-CM | POA: Diagnosis present

## 2021-12-22 DIAGNOSIS — Z20822 Contact with and (suspected) exposure to covid-19: Secondary | ICD-10-CM | POA: Insufficient documentation

## 2021-12-22 DIAGNOSIS — J069 Acute upper respiratory infection, unspecified: Secondary | ICD-10-CM | POA: Diagnosis not present

## 2021-12-22 LAB — RAPID INFLUENZA A&B ANTIGENS
Influenza A (ARMC): NEGATIVE
Influenza B (ARMC): NEGATIVE

## 2021-12-22 MED ORDER — PROMETHAZINE-DM 6.25-15 MG/5ML PO SYRP: 5.0000 mL | ORAL_SOLUTION | Freq: Four times a day (QID) | ORAL | 0 refills | Status: DC | PRN

## 2021-12-22 MED ORDER — BENZONATATE 100 MG PO CAPS: 200.0000 mg | ORAL_CAPSULE | Freq: Three times a day (TID) | ORAL | 0 refills | Status: DC

## 2021-12-22 MED ORDER — IPRATROPIUM BROMIDE 0.06 % NA SOLN: 2.0000 | Freq: Four times a day (QID) | NASAL | 12 refills | Status: DC

## 2021-12-22 NOTE — ED Triage Notes (Signed)
Pt here with C/O coughing up bloody mucus for 2 days, temp is 99-100, sore throat.

## 2021-12-22 NOTE — Discharge Instructions (Addendum)
Use the Atrovent nasal spray, 2 squirts in each nostril every 6 hours, as needed for runny nose and postnasal drip.  Use the Tessalon Perles every 8 hours during the day.  Take them with a small sip of water.  They may give you some numbness to the base of your tongue or a metallic taste in your mouth, this is normal.  Use the Promethazine DM cough syrup at bedtime for cough and congestion.  It will make you drowsy so do not take it during the day.  Use over-the-counter Tylenol and ibuprofen according to package instructions as needed for pain or fever.  Return for reevaluation or see your primary care provider for any new or worsening symptoms.

## 2021-12-22 NOTE — ED Provider Notes (Signed)
[facility]    CSN: [id number] Arrival date & time: 12/22/21  1050      History   Chief Complaint Chief Complaint  Patient presents with   Cough    HPI Debra Cline is a 25 y.o. female.   HPI  69 old female here for evaluation of respiratory symptoms.  Patient reports that for last 2 days she has been experiencing fever with a T-max of 100, chills, sweats, body aches, headache, left ear fullness, nasal congestion, postnasal drip, sore throat, and a cough that is productive for yellow mucus with occasional streaks of blood.  She denies any nausea, vomiting, or diarrhea.  She reports that she was at work this past week and multiple of her coworkers were suffering from similar symptoms.  History reviewed. No pertinent past medical history.  There are no problems to display for this patient.   History reviewed. No pertinent surgical history.  OB History   No obstetric history on file.      Home Medications    Prior to Admission medications   Medication Sig Start Date End Date Taking? Authorizing Provider  albuterol (VENTOLIN HFA) 108 (90 Base) MCG/ACT inhaler Inhale 2 puffs into the lungs every 6 (six) hours. 04/12/20  Yes [provider]  benzonatate (TESSALON) 100 MG capsule Take 2 capsules (200 mg total) by mouth every 8 (eight) hours. 12/22/21  Yes Becky Augusta, NP  EPINEPHrine 0.3 mg/0.3 mL IJ SOAJ injection Inject 0.3 mg into the muscle once.   Yes [provider]  ipratropium (ATROVENT) 0.06 % nasal spray Place 2 sprays into both nostrils 4 (four) times daily. 12/22/21  Yes Becky Augusta, NP  promethazine-dextromethorphan (PROMETHAZINE-DM) 6.25-15 MG/5ML syrup Take 5 mLs by mouth 4 (four) times daily as needed. 12/22/21  Yes Becky Augusta, NP    Family History Family History  Family history unknown: Yes    Social History Social History   Tobacco Use   Smoking status: Never   Smokeless tobacco: Never  Substance Use Topics    Alcohol use: No   Drug use: No     Allergies   Mold extract [trichophyton] and Peanut-containing drug products   Review of Systems Review of Systems  Constitutional:  Positive for chills, diaphoresis and fever. Negative for activity change and appetite change.  HENT:  Positive for congestion, ear pain, postnasal drip and sore throat.   Respiratory:  Positive for cough. Negative for shortness of breath and wheezing.   Gastrointestinal:  Negative for diarrhea, nausea and vomiting.  Musculoskeletal:  Positive for arthralgias and myalgias.  Skin:  Negative for rash.  Neurological:  Positive for headaches.  Hematological: Negative.   Psychiatric/Behavioral: Negative.      Physical Exam Triage Vital Signs ED Triage Vitals  Enc Vitals Group     BP 12/22/21 1209 121/78     Pulse Rate 12/22/21 1209 68     Resp 12/22/21 1209 18     Temp 12/22/21 1209 98.6 F (37 C)     Temp Source 12/22/21 1209 Oral     SpO2 12/22/21 1209 100 %     Weight 12/22/21 1207 170 lb (77.1 kg)     Height 12/22/21 1207 5\' 6"  (1.676 m)     Head Circumference --      Peak Flow --      Pain Score 12/22/21 1207 0     Pain Loc --      Pain Edu? --      Excl.  in GC? --    No data found.  Updated Vital Signs BP 121/78 (BP Location: Left Arm)    Pulse 68    Temp 98.6 F (37 C) (Oral)    Resp 18    Ht 5\' 6"  (1.676 m)    Wt 170 lb (77.1 kg)    SpO2 100%    BMI 27.44 kg/m   Visual Acuity Right Eye Distance:   Left Eye Distance:   Bilateral Distance:    Right Eye Near:   Left Eye Near:    Bilateral Near:     Physical Exam Vitals and nursing note reviewed.  Constitutional:      General: She is not in acute distress.    Appearance: Normal appearance. She is normal weight. She is ill-appearing.  HENT:     Head: Normocephalic and atraumatic.     Right Ear: Tympanic membrane, ear canal and external ear normal. There is no impacted cerumen.     Left Ear: Tympanic membrane, ear canal and external ear  normal. There is no impacted cerumen.     Nose: Congestion and rhinorrhea present.     Mouth/Throat:     Mouth: Mucous membranes are moist.     Pharynx: Oropharynx is clear. Posterior oropharyngeal erythema present.  Cardiovascular:     Rate and Rhythm: Normal rate and regular rhythm.     Pulses: Normal pulses.     Heart sounds: Normal heart sounds. No murmur heard.   No gallop.  Pulmonary:     Effort: Pulmonary effort is normal.     Breath sounds: Normal breath sounds. No wheezing, rhonchi or rales.  Musculoskeletal:     Cervical back: Normal range of motion and neck supple.  Lymphadenopathy:     Cervical: No cervical adenopathy.  Skin:    General: Skin is warm and dry.     Capillary Refill: Capillary refill takes less than 2 seconds.     Findings: No erythema or rash.  Neurological:     General: No focal deficit present.     Mental Status: She is alert and oriented to person, place, and time.  Psychiatric:        Mood and Affect: Mood normal.        Behavior: Behavior normal.        Thought Content: Thought content normal.        Judgment: Judgment normal.     UC Treatments / Results  Labs (all labs ordered are listed, but only abnormal results are displayed) Labs Reviewed  RAPID INFLUENZA A&B ANTIGENS  SARS CORONAVIRUS 2 (TAT 6-24 HRS)    EKG   Radiology No results found.  Procedures Procedures (including critical care time)  Medications Ordered in UC Medications - No data to display  Initial Impression / Assessment and Plan / UC Course  I have reviewed the triage vital signs and the nursing notes.  Pertinent labs & imaging results that were available during my care of the patient were reviewed by me and considered in my medical decision making (see chart for details).  Patient is a pleasant though ill-appearing 6 old female here for evaluation of flulike symptoms that began 2 days ago as outlined in the HPI above.  Her main complaints are left ear  fullness, nasal congestion and postnasal drip, chills, sweats, body aches, headache that is focused on the left side, and a cough that is productive for yellow mucus and occasionally has streaks of blood in it.  No shortness  of breath or wheezing and no GI complaints.  Physical exam reveals pearly gray tympanic membranes bilaterally with normal light reflex and clear external auditory canals.  Nasal mucosa is erythematous and edematous with clear discharge in both nares.  Oropharyngeal exam reveals posterior oropharyngeal erythema and injection with clear postnasal drip.  No cervical adenopathy appreciated exam.  Cardiopulmonary exam feels clear lung sounds in all fields.  Patient exam is consistent with a viral upper respiratory infection.  We will check patient for rapid flu here in clinic and is send out COVID.  Rapid influenza is negative.  Will discharge patient home to isolate pending the results of her COVID test.  Will give Atrovent nasal spray, Tessalon Perles, and Promethazine DM cough syrup to help with cough and congestion.  She can use Tylenol and ibuprofen as needed for headache or fever.   Final Clinical Impressions(s) / UC Diagnoses   Final diagnoses:  Viral URI with cough     Discharge Instructions      Use the Atrovent nasal spray, 2 squirts in each nostril every 6 hours, as needed for runny nose and postnasal drip.  Use the Tessalon Perles every 8 hours during the day.  Take them with a small sip of water.  They may give you some numbness to the base of your tongue or a metallic taste in your mouth, this is normal.  Use the Promethazine DM cough syrup at bedtime for cough and congestion.  It will make you drowsy so do not take it during the day.  Use over-the-counter Tylenol and ibuprofen according to package instructions as needed for pain or fever.  Return for reevaluation or see your primary care provider for any new or worsening symptoms.      ED Prescriptions      Medication Sig Dispense Auth. Provider   benzonatate (TESSALON) 100 MG capsule Take 2 capsules (200 mg total) by mouth every 8 (eight) hours. 21 capsule Becky Augusta, NP   ipratropium (ATROVENT) 0.06 % nasal spray Place 2 sprays into both nostrils 4 (four) times daily. 15 mL Becky Augusta, NP   promethazine-dextromethorphan (PROMETHAZINE-DM) 6.25-15 MG/5ML syrup Take 5 mLs by mouth 4 (four) times daily as needed. 118 mL Becky Augusta, NP      PDMP not reviewed this encounter.   Becky Augusta, NP 12/22/21 1343

## 2021-12-23 LAB — SARS CORONAVIRUS 2 (TAT 6-24 HRS): SARS Coronavirus 2: NEGATIVE

## 2022-06-20 ENCOUNTER — Other Ambulatory Visit: Payer: Self-pay | Admitting: Obstetrics & Gynecology

## 2022-06-20 DIAGNOSIS — N6011 Diffuse cystic mastopathy of right breast: Secondary | ICD-10-CM

## 2022-07-02 ENCOUNTER — Ambulatory Visit
Admission: RE | Admit: 2022-07-02 | Discharge: 2022-07-02 | Disposition: A | Discharge status: Home / Self Care | Payer: No Typology Code available for payment source | Source: Ambulatory Visit | Attending: Obstetrics & Gynecology | Admitting: Obstetrics & Gynecology

## 2022-07-02 DIAGNOSIS — N6011 Diffuse cystic mastopathy of right breast: Secondary | ICD-10-CM

## 2023-03-20 DIAGNOSIS — O321XX1 Maternal care for breech presentation, fetus 1: Secondary | ICD-10-CM | POA: Insufficient documentation

## 2023-08-20 ENCOUNTER — Ambulatory Visit
Admission: EM | Admit: 2023-08-20 | Discharge: 2023-08-20 | Disposition: A | Discharge status: Home / Self Care | Payer: No Typology Code available for payment source | Attending: Emergency Medicine | Admitting: Emergency Medicine

## 2023-08-20 DIAGNOSIS — B9689 Other specified bacterial agents as the cause of diseases classified elsewhere: Secondary | ICD-10-CM | POA: Diagnosis not present

## 2023-08-20 DIAGNOSIS — N760 Acute vaginitis: Secondary | ICD-10-CM | POA: Insufficient documentation

## 2023-08-20 LAB — URINALYSIS, W/ REFLEX TO CULTURE (INFECTION SUSPECTED)
Bilirubin Urine: NEGATIVE
Glucose, UA: NEGATIVE mg/dL
Hgb urine dipstick: NEGATIVE
Ketones, ur: NEGATIVE mg/dL
Leukocytes,Ua: NEGATIVE
Nitrite: NEGATIVE
Protein, ur: NEGATIVE mg/dL
RBC / HPF: NONE SEEN RBC/hpf (ref 0–5)
Specific Gravity, Urine: 1.02 (ref 1.005–1.030)
pH: 6 (ref 5.0–8.0)

## 2023-08-20 LAB — WET PREP, GENITAL
Sperm: NONE SEEN
Trich, Wet Prep: NONE SEEN
WBC, Wet Prep HPF POC: >10 — AB (ref <10)
Yeast Wet Prep HPF POC: NONE SEEN

## 2023-08-20 MED ORDER — METRONIDAZOLE 500 MG PO TABS: 500.0000 mg | ORAL_TABLET | Freq: Two times a day (BID) | ORAL | 0 refills | Status: DC

## 2023-08-20 NOTE — ED Provider Notes (Signed)
[facility]    CSN: [id number] Arrival date & time: 08/20/23  1313      History   Chief Complaint Chief Complaint  Patient presents with   Abdominal Pain   Vaginal Itching   Dysuria    HPI Debra Cline is a 27 y.o. female.   HPI  27 year old female with a past medical history significant for cesarean section 5 months ago presents for evaluation of lower abdominal pain, vaginal itching, urinary frequency, urgency, and dysuria x 14 days.  No blood in her urine.  She also denies fever.  History reviewed. No pertinent past medical history.  Patient Active Problem List   Diagnosis Date Noted   Breech birth, fetus 1 03/20/2023   Cyst of Bartholin's gland duct 04/21/2017    History reviewed. No pertinent surgical history.  OB History   No obstetric history on file.      Home Medications    Prior to Admission medications   Medication Sig Start Date End Date Taking? Authorizing Provider  metroNIDAZOLE (FLAGYL) 500 MG tablet Take 1 tablet (500 mg total) by mouth 2 (two) times daily. 08/20/23  Yes Becky Augusta, NP    Family History Family History  Family history unknown: Yes    Social History Social History   Tobacco Use   Smoking status: Never   Smokeless tobacco: Never  Substance Use Topics   Alcohol use: No   Drug use: No     Allergies   Candida albicans, Lactase-lactobacillus, Peanut oil, Peanut-containing drug products, Shellfish-derived products, Tilactase, Trichophyton, Molds & smuts, and Yeast   Review of Systems Review of Systems  Constitutional:  Negative for fever.  Gastrointestinal:  Positive for abdominal pain. Negative for nausea and vomiting.  Genitourinary:  Positive for dysuria, frequency, urgency and vaginal pain. Negative for hematuria and vaginal discharge.  Musculoskeletal:  Negative for back pain.     Physical Exam Triage Vital Signs ED Triage Vitals  Encounter Vitals Group     BP      Systolic BP Percentile       Diastolic BP Percentile      Pulse      Resp      Temp      Temp src      SpO2      Weight      Height      Head Circumference      Peak Flow      Pain Score      Pain Loc      Pain Education      Exclude from Growth Chart    No data found.  Updated Vital Signs BP (!) 142/87 (BP Location: Right Arm)   Pulse 80   Temp 98.6 F (37 C) (Oral)   Resp 16   Ht 5\' 6"  (1.676 m)   Wt 163 lb (73.9 kg)   LMP 08/09/2023 (Exact Date)   SpO2 98%   BMI 26.31 kg/m   Visual Acuity Right Eye Distance:   Left Eye Distance:   Bilateral Distance:    Right Eye Near:   Left Eye Near:    Bilateral Near:     Physical Exam Vitals and nursing note reviewed.  Constitutional:      Appearance: Normal appearance. She is not ill-appearing.  HENT:     Head: Normocephalic and atraumatic.  Cardiovascular:     Rate and Rhythm: Normal rate and regular rhythm.     Pulses: Normal pulses.  Heart sounds: Normal heart sounds. No murmur heard.    No friction rub. No gallop.  Pulmonary:     Effort: Pulmonary effort is normal.     Breath sounds: Normal breath sounds. No wheezing, rhonchi or rales.  Abdominal:     General: Abdomen is flat.     Palpations: Abdomen is soft.     Tenderness: There is abdominal tenderness. There is no right CVA tenderness, left CVA tenderness, guarding or rebound.     Comments: Bilateral lower abdominal tenderness with out guarding or rebound.  Low transverse C-section scar is well-healed and free of erythema, induration, warmth, or fluctuance.  Skin:    General: Skin is warm and dry.     Capillary Refill: Capillary refill takes less than 2 seconds.     Findings: No erythema or rash.  Neurological:     General: No focal deficit present.     Mental Status: She is alert and oriented to person, place, and time.      UC Treatments / Results  Labs (all labs ordered are listed, but only abnormal results are displayed) Labs Reviewed  WET PREP, GENITAL -  Abnormal; Notable for the following components:      Result Value   Clue Cells Wet Prep HPF POC PRESENT (*)    WBC, Wet Prep HPF POC >10 (*)    All other components within normal limits  URINALYSIS, W/ REFLEX TO CULTURE (INFECTION SUSPECTED) - Abnormal; Notable for the following components:   Bacteria, UA RARE (*)    All other components within normal limits  CERVICOVAGINAL ANCILLARY ONLY    EKG   Radiology No results found.  Procedures Procedures (including critical care time)  Medications Ordered in UC Medications - No data to display  Initial Impression / Assessment and Plan / UC Course  I have reviewed the triage vital signs and the nursing notes.  Pertinent labs & imaging results that were available during my care of the patient were reviewed by me and considered in my medical decision making (see chart for details).   Patient is a pleasant, nontoxic-appearing 27 year old female presenting for evaluation of 2 weeks worth of genitourinary symptoms as outlined HPI above.  Vital signs are unremarkable.  Physical exam reveals no CVA tenderness.  Abdomen is tender in the lower abdomen but no guarding or rebound.  No peritoneal signs noted.  Patient's dysuria and vaginal irritation but no vaginal discharge.  I am suspicious that this is either UTI, vaginal yeast infection, or bacterial vaginosis.  Perhaps a combination.  I will order a urinalysis to evaluate for the presence of UTI as well as a vaginal wet prep to look for bacterial vaginosis or vaginal yeast infection.  Patient is also concern for possible STIs and has she went through a recent bad break-up secondary to infidelity.  I will also order a cervical vaginal swab to evaluate the presence of gonorrhea and/or chlamydia.  Urinalysis is unremarkable.  Vaginal wet prep is positive for clue cells.  Cervicovaginal pending.  I will discharge patient home with diagnosis of bacterial vaginosis and treat her with metronidazole 500  mg twice daily for 7 days.  She reports that she is not breast-feeding at present.   Final Clinical Impressions(s) / UC Diagnoses   Final diagnoses:  BV (bacterial vaginosis)     Discharge Instructions      Take the Flagyl (metronidazole) 500 mg twice daily for treatment of your bacterial vaginosis.  Avoid alcohol while on the  metronidazole as taken together will cause of vomiting.  Bacterial vaginosis is often caused by a imbalance of bacteria in your vaginal vault.  This is sometimes a result of using tampons or hormonal fluctuations during her menstrual cycle.  You if your symptoms are recurrent you can try using a boric acid suppository twice weekly to help maintain the acid-base balance in your vagina vault which could prevent further infection.  You can also try vaginal probiotics to help return normal bacterial balance.      ED Prescriptions     Medication Sig Dispense Auth. Provider   metroNIDAZOLE (FLAGYL) 500 MG tablet Take 1 tablet (500 mg total) by mouth 2 (two) times daily. 14 tablet Becky Augusta, NP      PDMP not reviewed this encounter.   Becky Augusta, NP 08/20/23 1400

## 2023-08-20 NOTE — ED Triage Notes (Signed)
Pt c/o abd pain,vaginal itching & urinary freq,pressure,burning x14 days. Denies any hematuria. Has tried tylenol w/o relief.

## 2023-08-20 NOTE — Discharge Instructions (Addendum)
Take the Flagyl (metronidazole) 500 mg twice daily for treatment of your bacterial vaginosis.  Avoid alcohol while on the metronidazole as taken together will cause of vomiting.  Bacterial vaginosis is often caused by a imbalance of bacteria in your vaginal vault.  This is sometimes a result of using tampons or hormonal fluctuations during her menstrual cycle.  You if your symptoms are recurrent you can try using a boric acid suppository twice weekly to help maintain the acid-base balance in your vagina vault which could prevent further infection.  You can also try vaginal probiotics to help return normal bacterial balance.  

## 2023-08-28 LAB — CERVICOVAGINAL ANCILLARY ONLY
Chlamydia: NEGATIVE
Comment: NEGATIVE
Comment: NORMAL
Neisseria Gonorrhea: NEGATIVE

## 2023-09-08 ENCOUNTER — Ambulatory Visit
Admission: EM | Admit: 2023-09-08 | Discharge: 2023-09-08 | Disposition: A | Discharge status: Home / Self Care | Payer: No Typology Code available for payment source | Attending: Emergency Medicine | Admitting: Emergency Medicine

## 2023-09-08 DIAGNOSIS — L03032 Cellulitis of left toe: Secondary | ICD-10-CM

## 2023-09-08 MED ORDER — AMOXICILLIN-POT CLAVULANATE 875-125 MG PO TABS: 1.0000 | ORAL_TABLET | Freq: Two times a day (BID) | ORAL | 0 refills | Status: AC

## 2023-09-08 MED ORDER — FLUCONAZOLE 150 MG PO TABS: 150.0000 mg | ORAL_TABLET | ORAL | 0 refills | Status: AC

## 2023-09-08 NOTE — ED Triage Notes (Signed)
Sx started 4 days. Left great toe. Painful to the touch. She pressed on the nail this morning and puss came out.

## 2023-09-08 NOTE — ED Provider Notes (Signed)
[facility]    CSN: [id number] Arrival date & time: 09/08/23  1300      History   Chief Complaint Chief Complaint  Patient presents with   Toe Pain    HPI Debra Cline is a 27 y.o. female.   HPI  27 year old female with a past medical history significant for Bartholin cyst and BV presents for evaluation of pain in her left great toe.  She reports that the pain has been going on for last 4 days and that this morning she pressed on the nail and pus came out.  She denies any fever.  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  No past medical history on file.  Patient Active Problem List   Diagnosis Date Noted   Breech birth, fetus 1 03/20/2023   Cyst of Bartholin's gland duct 04/21/2017    Past Surgical History:  Procedure Laterality Date   CESAREAN SECTION      OB History   No obstetric history on file.      Home Medications    Prior to Admission medications   Medication Sig Start Date End Date Taking? Authorizing Provider  amoxicillin-clavulanate (AUGMENTIN) 875-125 MG tablet Take 1 tablet by mouth every 12 (twelve) hours for 10 days. 09/08/23 09/18/23 Yes Becky Augusta, NP  fluconazole (DIFLUCAN) 150 MG tablet Take 1 tablet (150 mg total) by mouth every 3 (three) days for 3 doses. 09/08/23 09/15/23 Yes Becky Augusta, NP    Family History Family History  Family history unknown: Yes    Social History Social History   Tobacco Use   Smoking status: Never   Smokeless tobacco: Never  Vaping Use   Vaping status: Never Used  Substance Use Topics   Alcohol use: No   Drug use: No     Allergies   Candida albicans, Lactase-lactobacillus, Peanut oil, Peanut-containing drug products, Shellfish-derived products, Tilactase, Trichophyton, Molds & smuts, and Yeast   Review of Systems Review of Systems  Constitutional:  Negative for fever.  Musculoskeletal:  Negative for arthralgias and joint swelling.  Skin:  Positive for color  change.     Physical Exam Triage Vital Signs ED Triage Vitals [09/08/23 1340]  Encounter Vitals Group     BP      Systolic BP Percentile      Diastolic BP Percentile      Pulse      Resp      Temp      Temp src      SpO2      Weight 168 lb (76.2 kg)     Height      Head Circumference      Peak Flow      Pain Score 6     Pain Loc      Pain Education      Exclude from Growth Chart    No data found.  Updated Vital Signs BP 124/78 (BP Location: Left Arm)   Pulse 63   Temp 98.2 F (36.8 C) (Oral)   Resp 17   Wt 168 lb (76.2 kg)   LMP 08/28/2023 (Exact Date)   SpO2 100%   BMI 27.12 kg/m   Visual Acuity Right Eye Distance:   Left Eye Distance:   Bilateral Distance:    Right Eye Near:   Left Eye Near:    Bilateral Near:     Physical Exam Vitals and nursing note reviewed.  Constitutional:      Appearance:  Normal appearance. She is not ill-appearing.  HENT:     Head: Normocephalic and atraumatic.  Skin:    General: Skin is warm and dry.     Capillary Refill: Capillary refill takes less than 2 seconds.     Findings: Erythema present.  Neurological:     General: No focal deficit present.     Mental Status: She is alert and oriented to person, place, and time.      UC Treatments / Results  Labs (all labs ordered are listed, but only abnormal results are displayed) Labs Reviewed - No data to display  EKG   Radiology No results found.  Procedures Procedures (including critical care time)  Medications Ordered in UC Medications - No data to display  Initial Impression / Assessment and Plan / UC Course  I have reviewed the triage vital signs and the nursing notes.  Pertinent labs & imaging results that were available during my care of the patient were reviewed by me and considered in my medical decision making (see chart for details).   Patient is a pleasant, nontoxic-appearing 27 year old female presenting for evaluation of pain in her left great  toe x 4 days as outlined HPI above.  As you can see image above, there is some mild erythema to the medial cuticle of the left great toenail.  The toenail itself does not appear to be ingrown.  There is no induration or fluctuance noted.  The area is tender to touch.  I do suspect that the patient has a developing paronychia, possibly related to her recent pedicure.  I will start her on Augmentin 875 twice daily for 10 days and have her soak her foot in warm water and Epsom salts twice daily to help promote drainage of any infection.  If she has any increase in redness, swelling, pain, red streaks going up her toe, or fever she should return for reevaluation.  Work note provided.  Final Clinical Impressions(s) / UC Diagnoses   Final diagnoses:  Paronychia of great toe of left foot     Discharge Instructions      Take the Augmentin twice daily with food for 10 days.  Soak your foot in warm water and Epsom salts 2-3 times a day to help facilitate drainage.  Keep a dressing on your toe until the drainage has stopped.  You can use over-the-counter Tylenol and ibuprofen according to the package instructions as needed for pain.  Return for reevaluation if you develop any increased redness, swelling, drainage, or red streaks going up your finger, or fever.      ED Prescriptions     Medication Sig Dispense Auth. Provider   amoxicillin-clavulanate (AUGMENTIN) 875-125 MG tablet Take 1 tablet by mouth every 12 (twelve) hours for 10 days. 20 tablet Becky Augusta, NP   fluconazole (DIFLUCAN) 150 MG tablet Take 1 tablet (150 mg total) by mouth every 3 (three) days for 3 doses. 3 tablet Becky Augusta, NP      PDMP not reviewed this encounter.   Becky Augusta, NP 09/08/23 1354

## 2023-09-08 NOTE — Discharge Instructions (Signed)
Take the Augmentin twice daily with food for 10 days.  Soak your foot in warm water and Epsom salts 2-3 times a day to help facilitate drainage.  Keep a dressing on your toe until the drainage has stopped.  You can use over-the-counter Tylenol and ibuprofen according to the package instructions as needed for pain.  Return for reevaluation if you develop any increased redness, swelling, drainage, or red streaks going up your finger, or fever.

## 2023-10-06 ENCOUNTER — Ambulatory Visit: Payer: No Typology Code available for payment source | Admitting: Family Medicine

## 2023-10-25 ENCOUNTER — Ambulatory Visit
Admission: EM | Admit: 2023-10-25 | Discharge: 2023-10-25 | Disposition: A | Discharge status: Home / Self Care | Payer: No Typology Code available for payment source | Attending: Emergency Medicine | Admitting: Emergency Medicine

## 2023-10-25 DIAGNOSIS — H66001 Acute suppurative otitis media without spontaneous rupture of ear drum, right ear: Secondary | ICD-10-CM

## 2023-10-25 DIAGNOSIS — J039 Acute tonsillitis, unspecified: Secondary | ICD-10-CM

## 2023-10-25 MED ORDER — AMOXICILLIN-POT CLAVULANATE 875-125 MG PO TABS: 1.0000 | ORAL_TABLET | Freq: Two times a day (BID) | ORAL | 0 refills | Status: AC

## 2023-10-25 NOTE — ED Provider Notes (Signed)
[facility]    CSN: [id number] Arrival date & time: 10/25/23  1555      History   Chief Complaint Chief Complaint  Patient presents with   Sore Throat   Fever   Chills    HPI Debra Cline is a 27 y.o. female.   HPI  27 year old female with no significant past medical history presents for evaluation of runny nose, nasal congestion, chills, subjective fever, sore throat, and right ear pain.  The symptoms have been present for the last week.  History reviewed. No pertinent past medical history.  Patient Active Problem List   Diagnosis Date Noted   Breech birth, fetus 1 03/20/2023   Cyst of Bartholin's gland duct 04/21/2017    Past Surgical History:  Procedure Laterality Date   CESAREAN SECTION      OB History   No obstetric history on file.      Home Medications    Prior to Admission medications   Medication Sig Start Date End Date Taking? Authorizing Provider  amoxicillin-clavulanate (AUGMENTIN) 875-125 MG tablet Take 1 tablet by mouth every 12 (twelve) hours for 10 days. 10/25/23 11/04/23 Yes Becky Augusta, NP    Family History Family History  Family history unknown: Yes    Social History Social History   Tobacco Use   Smoking status: Never   Smokeless tobacco: Never  Vaping Use   Vaping status: Never Used  Substance Use Topics   Alcohol use: No   Drug use: No     Allergies   Candida albicans, Lactase-lactobacillus, Peanut oil, Peanut-containing drug products, Shellfish-derived products, Tilactase, Trichophyton, Molds & smuts, and Yeast   Review of Systems Review of Systems  Constitutional:  Positive for chills and fever.  HENT:  Positive for congestion, ear pain, rhinorrhea and sore throat. Negative for ear discharge.   Respiratory:  Negative for cough.      Physical Exam Triage Vital Signs ED Triage Vitals  Encounter Vitals Group     BP      Systolic BP Percentile      Diastolic BP Percentile      Pulse       Resp      Temp      Temp src      SpO2      Weight      Height      Head Circumference      Peak Flow      Pain Score      Pain Loc      Pain Education      Exclude from Growth Chart    No data found.  Updated Vital Signs BP 128/71 (BP Location: Left Arm)   Pulse 77   Temp 98.5 F (36.9 C) (Oral)   Resp 16   Ht 5\' 6"  (1.676 m)   Wt 163 lb (73.9 kg)   SpO2 100%   BMI 26.31 kg/m   Visual Acuity Right Eye Distance:   Left Eye Distance:   Bilateral Distance:    Right Eye Near:   Left Eye Near:    Bilateral Near:     Physical Exam Vitals and nursing note reviewed.  Constitutional:      Appearance: Normal appearance. She is not ill-appearing.  HENT:     Head: Normocephalic and atraumatic.     Right Ear: Ear canal and external ear normal. There is no impacted cerumen.     Left Ear: Tympanic membrane, ear canal and external ear normal.  There is no impacted cerumen.     Ears:     Comments: Right tympanic membrane is erythematous and injected with loss of landmarks.  Left TM is pearly gray in appearance.  Both EACs are clear.    Nose: Congestion and rhinorrhea present.     Comments: Nasal mucosa is erythematous and edematous with scant clear discharge in both nares.    Mouth/Throat:     Mouth: Mucous membranes are moist.     Pharynx: Oropharynx is clear. Posterior oropharyngeal erythema present. No oropharyngeal exudate.     Comments: Tonsillar pillars are 2+ edematous and erythematous but free of exudate. Cardiovascular:     Rate and Rhythm: Normal rate and regular rhythm.     Pulses: Normal pulses.     Heart sounds: Normal heart sounds. No murmur heard.    No friction rub. No gallop.  Pulmonary:     Effort: Pulmonary effort is normal.     Breath sounds: Normal breath sounds. No wheezing, rhonchi or rales.  Musculoskeletal:     Cervical back: Normal range of motion and neck supple.  Lymphadenopathy:     Cervical: No cervical adenopathy.  Skin:    General: Skin  is warm and dry.     Capillary Refill: Capillary refill takes less than 2 seconds.     Findings: No erythema or rash.  Neurological:     General: No focal deficit present.     Mental Status: She is alert and oriented to person, place, and time.      UC Treatments / Results  Labs (all labs ordered are listed, but only abnormal results are displayed) Labs Reviewed - No data to display  EKG   Radiology No results found.  Procedures Procedures (including critical care time)  Medications Ordered in UC Medications - No data to display  Initial Impression / Assessment and Plan / UC Course  I have reviewed the triage vital signs and the nursing notes.  Pertinent labs & imaging results that were available during my care of the patient were reviewed by me and considered in my medical decision making (see chart for details).   Patient is a pleasant, nontoxic-appearing 27 year old female presenting for evaluation of sore throat and right ear pain as outlined HPI above.  On exam she does have an otitis media on the right as well as edematous and erythematous tonsillar pillars.  I will treat her for otitis media and tonsillitis with Augmentin 875 twice daily for 10 days.  Tylenol and or ibuprofen as needed for pain and fever.  Salt water gargles and over-the-counter Chloraseptic or Sucrets lozenges for sore throat relief.  Return precautions reviewed.   Final Clinical Impressions(s) / UC Diagnoses   Final diagnoses:  Non-recurrent acute suppurative otitis media of right ear without spontaneous rupture of tympanic membrane  Acute tonsillitis, unspecified etiology     Discharge Instructions      Take the Augmentin twice daily for 10 days with food for treatment of your ear infection.  Take an over-the-counter probiotic 1 hour after each dose of antibiotic to prevent diarrhea.  Use over-the-counter Tylenol and ibuprofen as needed for pain or fever.  Place a hot water bottle, or  heating pad, underneath your pillowcase at night to help dilate up your ear and aid in pain relief as well as resolution of the infection.  Gargle with warm salt water 2-3 times a day to soothe your throat, aid in pain relief, and aid in healing.  Take  over-the-counter ibuprofen according to the package instructions as needed for pain.  You can also use Chloraseptic or Sucrets lozenges, 1 lozenge every 2 hours as needed for throat pain.  If you develop any new or worsening symptoms return for reevaluation.      ED Prescriptions     Medication Sig Dispense Auth. Provider   amoxicillin-clavulanate (AUGMENTIN) 875-125 MG tablet Take 1 tablet by mouth every 12 (twelve) hours for 10 days. 20 tablet Becky Augusta, NP      PDMP not reviewed this encounter.   Becky Augusta, NP 10/25/23 1616

## 2023-10-25 NOTE — Discharge Instructions (Addendum)
Take the Augmentin twice daily for 10 days with food for treatment of your ear infection. ? ?Take an over-the-counter probiotic 1 hour after each dose of antibiotic to prevent diarrhea. ? ?Use over-the-counter Tylenol and ibuprofen as needed for pain or fever. ? ?Place a hot water bottle, or heating pad, underneath your pillowcase at night to help dilate up your ear and aid in pain relief as well as resolution of the infection. ? ?Gargle with warm salt water 2-3 times a day to soothe your throat, aid in pain relief, and aid in healing. ? ?Take over-the-counter ibuprofen according to the package instructions as needed for pain. ? ?You can also use Chloraseptic or Sucrets lozenges, 1 lozenge every 2 hours as needed for throat pain. ? ?If you develop any new or worsening symptoms return for reevaluation.  ?

## 2023-10-25 NOTE — ED Triage Notes (Signed)
Pt c/o sore throat,chills & fever x1 wk.

## 2023-11-06 ENCOUNTER — Other Ambulatory Visit: Payer: Self-pay | Admitting: Otolaryngology

## 2023-11-24 ENCOUNTER — Encounter: Payer: Self-pay | Admitting: Otolaryngology

## 2023-11-28 NOTE — Discharge Instructions (Signed)
T & A INSTRUCTION SHEET - [facility]  AUSTIN ROSE, MD    INFORMATION SHEET FOR A TONSILLECTOMY AND ADENDOIDECTOMY  About Your Tonsils and Adenoids  The tonsils and adenoids are normal body tissues that are part of our immune system.  They normally help to protect [place] against diseases that may enter our mouth and nose. However, sometimes the tonsils and/or adenoids become too large and obstruct our breathing, especially at night.    If either of these things happen it helps to remove the tonsils and adenoids in order to become healthier. The operation to remove the tonsils and adenoids is called a tonsillectomy and adenoidectomy.  The Location of Your Tonsils and Adenoids  The tonsils are located in the back of the throat on both side and sit in a cradle of muscles. The adenoids are located in the roof of the mouth, behind the nose, and closely associated with the opening of the Eustachian tube to the ear.  Surgery on Tonsils and Adenoids  A tonsillectomy and adenoidectomy is a short operation which takes about thirty minutes.  This includes being put to sleep and being awakened. Tonsillectomies and adenoidectomies are performed at [facility] and may require observation period in the recovery room prior to going home. Children are required to remain in recovery for at least 45 minutes.   Following the Operation for a Tonsillectomy  A cautery machine is used to control bleeding. Bleeding from a tonsillectomy and adenoidectomy is minimal and postoperatively the risk of bleeding is approximately four percent, although this rarely life threatening.  After your tonsillectomy and adenoidectomy post-op care at home: 1. Our patients are able to go home the same day. You may be given prescriptions for pain medications, if indicated. 2. It is extremely important to remember that fluid intake is of utmost importance after a tonsillectomy. The  amount that you drink must be maintained in the postoperative period. A good indication of whether a child is getting enough fluid is whether his/her urine output is constant. As long as children are urinating or wetting their diaper every 6 - 8 hours this is usually enough fluid intake.   3. Although rare, this is a risk of some bleeding in the first ten days after surgery. This usually occurs between day five and nine postoperatively. This risk of bleeding is approximately four percent. If you or your child should have any bleeding you should remain calm and notify our office or go directly to the emergency room at [facility] where they will contact us. Our doctors are available seven days a week for notification. We recommend sitting up quietly in a chair, place an ice pack on the front of the neck and spitting out the blood gently until we are able to contact you. Adults should gargle gently with ice water and this may help stop the bleeding. If the bleeding does not stop after a short time, i.e. 10 to 15 minutes, or seems to be increasing again, please contact us or go to the hospital.   4. It is common for the pain to be worse at 5 - 7 days postoperatively. This occurs because the "scab" is peeling off and the mucous membrane (skin of the throat) is growing back where the tonsils were.   5. It is common for a low-grade fever, less than 102, during the first week after a tonsillectomy and adenoidectomy. It is usually due to  not drinking enough liquids, and we suggest your use liquid Tylenol (acetaminophen) or the pain medicine with Tylenol (acetaminophen) prescribed in order to keep your temperature below 102. Please follow the directions on the back of the bottle. 6. Recommendations for post-operative pain in children and adults: a) For Children 12 and younger: Recommendations are for oral Tylenol (acetaminophen) and oral Motrin (ibuprofen). Administer the Tylenol (acetaminophen) and  Motrin as stated on bottle for patient's age/weight. Sometimes it may be necessary to alternate the Tylenol (acetaminophen) and Motrin for improved pain control. Motrin (ibuprofen) does last slightly longer so many patients benefit from being given this prior to bedtime. All children should avoid Aspirin products for 2 weeks following surgery. b) For children over the age of 92: Tylenol (acetaminophen) is the preferred first choice for pain control. Depending on your child's size, sometimes they will be given a combination of Tylenol (acetaminophen) and hydrocodone medication or sometimes it will be recommended they take Motrin (ibuprofen) in addition to the Tylenol (acetaminophen). Narcotics should always be used with caution in children following surgery as they can suppress their breathing and switching to over the counter Tylenol (acetaminophen) and Motrin (ibuprofen) as soon as possible is recommended. All patients should avoid Aspirin products for 2 weeks following surgery. c) Adults: Usually adults will require a narcotic pain medication following a tonsillectomy. This usually has either hydrocodone or oxycodone in it and can usually be taken every 4 to 6 hours as needed for moderate pain. If the medication does not have Tylenol (acetaminophen) in it, you may also supplement Tylenol (acetaminophen) as needed every 4 to 6 hours for breakthrough or mild pain. Adults should avoid Aspirin, Aleve, Motrin, and Ibuprofen products for 2 weeks following surgery as they can increase your risk of bleeding. 7. If you happen to look in the mirror or into your child's mouth you will see white/gray patches on the back of the throat. This is what a scab looks like in the mouth and is normal after having a tonsillectomy and adenoidectomy. They will disappear once the tonsil areas heal completely. However, it may cause a noticeable odor, and this too will disappear with time.     8. You or your child may experience ear  pain after having a tonsillectomy and adenoidectomy.  This is called referred pain and comes from the throat, but it is felt in the ears.  Ear pain is quite common and expected. It will usually go away after ten days. There is usually nothing wrong with the ears, and it is primarily due to the healing area stimulating the nerve to the ear that runs along the side of the throat. Use either the prescribed pain medicine or Tylenol (acetaminophen) as needed.  9. The throat tissues after a tonsillectomy are obviously sensitive. Smoking around children who have had a tonsillectomy significantly increases the risk of bleeding. DO NOT SMOKE!  What to Expect Each Day  First Day at Home 1. Patients will be discharged home the same day.  2. Drink at least four glasses of liquid a day. Clear, cool liquids are recommended. Fruit juices containing citric acid are not recommended because they tend to cause pain. Carbonated beverages are allowed if you pour them from glass to glass to remove the bubbles as these tend to cause discomfort. Avoid alcoholic beverages.  3. Eat very soft foods such as soups, broth, jello, custard, pudding, ice cream, popsicles, applesauce, mashed potatoes, and in general anything that you can crush between your  tongue and the roof of your mouth. Try adding [employer] Mix into your food for extra calories. It is not uncommon to lose 5 to 10 pounds of fluid weight. The weight will be gained back quickly once you're feeling better and drinking more.  4. Sleep with your head elevated on two pillows for about three days to help decrease the swelling.  5. DO NOT SMOKE!  Day Two  1. Rest as much as possible. Use common sense in your activities.  2. Continue drinking at least four glasses of liquid per day.  3. Follow the soft diet.  4. Use your pain medication as needed.  Day Three  1. Advance your activity as you are able and continue to follow the previous day's suggestions.   Days Four Through Six  1. Advance your diet and begin to eat more solid foods such as chopped hamburger. 2. Advance your activities slowly. Children should be kept mostly around the house.  3. Not uncommonly, there will be more pain at this time. It is temporary, usually lasting a day or two.  Day Seven Through Ten  1. Most individuals by this time are able to return to work or school unless otherwise instructed. Consider sending children back to school for a half day on the first day back.

## 2023-12-04 ENCOUNTER — Other Ambulatory Visit: Payer: Self-pay

## 2023-12-04 ENCOUNTER — Ambulatory Visit: Payer: No Typology Code available for payment source | Admitting: Anesthesiology

## 2023-12-04 ENCOUNTER — Encounter
Admission: RE | Disposition: A | Discharge status: Home / Self Care | Payer: Self-pay | Source: Home / Self Care | Attending: Otolaryngology

## 2023-12-04 ENCOUNTER — Encounter: Payer: Self-pay | Admitting: Otolaryngology

## 2023-12-04 ENCOUNTER — Ambulatory Visit
Admission: RE | Admit: 2023-12-04 | Discharge: 2023-12-04 | Disposition: A | Discharge status: Home / Self Care | Payer: No Typology Code available for payment source | Attending: Otolaryngology | Admitting: Otolaryngology

## 2023-12-04 DIAGNOSIS — J0391 Acute recurrent tonsillitis, unspecified: Secondary | ICD-10-CM | POA: Diagnosis present

## 2023-12-04 DIAGNOSIS — J358 Other chronic diseases of tonsils and adenoids: Secondary | ICD-10-CM | POA: Insufficient documentation

## 2023-12-04 HISTORY — DX: Other specified health status: Z78.9

## 2023-12-04 HISTORY — DX: Other specified postprocedural states: Z98.890

## 2023-12-04 HISTORY — PX: TONSILLECTOMY: SHX5217

## 2023-12-04 LAB — POCT PREGNANCY, URINE: Preg Test, Ur: NEGATIVE

## 2023-12-04 SURGERY — TONSILLECTOMY
Anesthesia: General | Laterality: Bilateral

## 2023-12-04 MED ORDER — ROCURONIUM BROMIDE 10 MG/ML (PF) SYRINGE
PREFILLED_SYRINGE | INTRAVENOUS | Status: AC
  Filled 2023-12-04: qty 10

## 2023-12-04 MED ORDER — LACTATED RINGERS IV SOLN: INTRAVENOUS | Status: DC

## 2023-12-04 MED ORDER — OXYCODONE HCL 5 MG/5ML PO SOLN
ORAL | Status: AC
  Filled 2023-12-04: qty 10

## 2023-12-04 MED ORDER — OXYCODONE HCL 5 MG/5ML PO SOLN: 5.0000 mg | ORAL | 0 refills | Status: AC | PRN

## 2023-12-04 MED ORDER — ROCURONIUM BROMIDE 100 MG/10ML IV SOLN
INTRAVENOUS | Status: DC | PRN
  Administered 2023-12-04: 40 mg via INTRAVENOUS

## 2023-12-04 MED ORDER — SCOPOLAMINE 1 MG/3DAYS TD PT72
MEDICATED_PATCH | TRANSDERMAL | Status: AC
  Filled 2023-12-04: qty 1

## 2023-12-04 MED ORDER — ONDANSETRON HCL 4 MG/2ML IJ SOLN
INTRAMUSCULAR | Status: AC
  Filled 2023-12-04: qty 2

## 2023-12-04 MED ORDER — SODIUM CHLORIDE 0.9 % IR SOLN
Status: DC | PRN
  Administered 2023-12-04: 1

## 2023-12-04 MED ORDER — BACITRACIN 500 UNIT/GM EX OINT
TOPICAL_OINTMENT | CUTANEOUS | Status: DC | PRN
  Administered 2023-12-04: 1 via TOPICAL

## 2023-12-04 MED ORDER — MIDAZOLAM HCL 2 MG/2ML IJ SOLN
INTRAMUSCULAR | Status: AC
  Filled 2023-12-04: qty 2

## 2023-12-04 MED ORDER — SCOPOLAMINE 1 MG/3DAYS TD PT72
1.0000 | MEDICATED_PATCH | Freq: Once | TRANSDERMAL | Status: DC
  Administered 2023-12-04: 1.5 mg via TRANSDERMAL

## 2023-12-04 MED ORDER — ONDANSETRON HCL 4 MG/2ML IJ SOLN
INTRAMUSCULAR | Status: DC | PRN
  Administered 2023-12-04: 4 mg via INTRAVENOUS

## 2023-12-04 MED ORDER — OXYMETAZOLINE HCL 0.05 % NA SOLN
NASAL | Status: DC | PRN
  Administered 2023-12-04: 1 via TOPICAL

## 2023-12-04 MED ORDER — DEXAMETHASONE SODIUM PHOSPHATE 4 MG/ML IJ SOLN
INTRAMUSCULAR | Status: AC
  Filled 2023-12-04: qty 2

## 2023-12-04 MED ORDER — LIDOCAINE HCL (PF) 2 % IJ SOLN
INTRAMUSCULAR | Status: AC
  Filled 2023-12-04: qty 5

## 2023-12-04 MED ORDER — FENTANYL CITRATE (PF) 100 MCG/2ML IJ SOLN
INTRAMUSCULAR | Status: DC | PRN
  Administered 2023-12-04: 100 ug via INTRAVENOUS

## 2023-12-04 MED ORDER — MIDAZOLAM HCL 5 MG/5ML IJ SOLN
INTRAMUSCULAR | Status: DC | PRN
  Administered 2023-12-04: 2 mg via INTRAVENOUS

## 2023-12-04 MED ORDER — LIDOCAINE HCL (CARDIAC) PF 100 MG/5ML IV SOSY
PREFILLED_SYRINGE | INTRAVENOUS | Status: DC | PRN
  Administered 2023-12-04: 80 mg via INTRAVENOUS

## 2023-12-04 MED ORDER — PROPOFOL 10 MG/ML IV BOLUS
INTRAVENOUS | Status: DC | PRN
  Administered 2023-12-04: 200 mg via INTRAVENOUS

## 2023-12-04 MED ORDER — FENTANYL CITRATE (PF) 100 MCG/2ML IJ SOLN
INTRAMUSCULAR | Status: AC
  Filled 2023-12-04: qty 2

## 2023-12-04 MED ORDER — SUGAMMADEX SODIUM 200 MG/2ML IV SOLN
INTRAVENOUS | Status: DC | PRN
  Administered 2023-12-04: 154.4 mg via INTRAVENOUS

## 2023-12-04 MED ORDER — OXYCODONE HCL 5 MG/5ML PO SOLN
10.0000 mg | Freq: Once | ORAL | Status: AC
  Administered 2023-12-04: 10 mg via ORAL

## 2023-12-04 MED ORDER — DEXAMETHASONE SODIUM PHOSPHATE 4 MG/ML IJ SOLN
INTRAMUSCULAR | Status: AC
  Filled 2023-12-04: qty 1

## 2023-12-04 MED ORDER — DEXAMETHASONE SODIUM PHOSPHATE 4 MG/ML IJ SOLN
INTRAMUSCULAR | Status: DC | PRN
  Administered 2023-12-04: 10 mg via INTRAVENOUS

## 2023-12-04 MED ORDER — ACETAMINOPHEN 10 MG/ML IV SOLN
INTRAVENOUS | Status: AC
  Filled 2023-12-04: qty 100

## 2023-12-04 MED ORDER — ACETAMINOPHEN 10 MG/ML IV SOLN
1000.0000 mg | Freq: Once | INTRAVENOUS | Status: AC
  Administered 2023-12-04: 1000 mg via INTRAVENOUS

## 2023-12-04 SURGICAL SUPPLY — 17 items
ANTIFOG SOL W/FOAM PAD STRL (MISCELLANEOUS) ×1
BLADE ELECT COATED/INSUL 125 (ELECTRODE) ×1 IMPLANT
CANISTER SUCT 1200ML W/VALVE (MISCELLANEOUS) ×1 IMPLANT
CATH ROBINSON RED A/P 10FR (CATHETERS) ×1 IMPLANT
COAGULATOR SUCTION 6 10FR HC (MISCELLANEOUS) IMPLANT
CORD BIP STRL DISP 12FT (MISCELLANEOUS) IMPLANT
ELECT REM PT RETURN 9FT ADLT (ELECTROSURGICAL) ×1
ELECTRODE REM PT RTRN 9FT ADLT (ELECTROSURGICAL) ×1 IMPLANT
GAUZE SPONGE 4X4 12PLY STRL (GAUZE/BANDAGES/DRESSINGS) IMPLANT
GLOVE PI ULTRA LF STRL 7.5 (GLOVE) ×1 IMPLANT
KIT TURNOVER KIT A (KITS) ×1 IMPLANT
NS IRRIG 500ML POUR BTL (IV SOLUTION) ×1 IMPLANT
PACK TONSIL AND ADENOID CUSTOM (PACKS) ×1 IMPLANT
SLEEVE SUCTION 125 (MISCELLANEOUS) ×1 IMPLANT
SOLUTION ANTFG W/FOAM PAD STRL (MISCELLANEOUS) ×1 IMPLANT
SPONGE TONSIL 1 RF SGL (DISPOSABLE) ×1 IMPLANT
STRAP BODY AND KNEE 60X3 (MISCELLANEOUS) ×1 IMPLANT

## 2023-12-04 NOTE — Anesthesia Preprocedure Evaluation (Addendum)
Anesthesia Evaluation  Patient identified by MRN, date of birth, ID band Patient awake    Reviewed: Allergy & Precautions, H&P , NPO status , Patient's Chart, lab work & pertinent test results  History of Anesthesia Complications (+) PONV and history of anesthetic complications  Airway Mallampati: II  TM Distance: >3 FB Neck ROM: Full    Dental no notable dental hx.    Pulmonary neg pulmonary ROS   Pulmonary exam normal breath sounds clear to auscultation       Cardiovascular negative cardio ROS Normal cardiovascular exam Rhythm:Regular Rate:Normal     Neuro/Psych negative neurological ROS  negative psych ROS   GI/Hepatic negative GI ROS, Neg liver ROS,,,  Endo/Other  negative endocrine ROS    Renal/GU negative Renal ROS  negative genitourinary   Musculoskeletal negative musculoskeletal ROS (+)    Abdominal   Peds negative pediatric ROS (+)  Hematology negative hematology ROS (+)   Anesthesia Other Findings PONV  Nose piercing removed preop   Reproductive/Obstetrics negative OB ROS                             Anesthesia Physical Anesthesia Plan  ASA: 1  Anesthesia Plan: General ETT   Post-op Pain Management:    Induction: Intravenous  PONV Risk Score and Plan:   Airway Management Planned: Oral ETT  Additional Equipment:   Intra-op Plan:   Post-operative Plan: Extubation in OR  Informed Consent: I have reviewed the patients History and Physical, chart, labs and discussed the procedure including the risks, benefits and alternatives for the proposed anesthesia with the patient or authorized representative who has indicated his/her understanding and acceptance.     Dental Advisory Given  Plan Discussed with: Anesthesiologist, CRNA and Surgeon  Anesthesia Plan Comments: (Patient consented for risks of anesthesia including but not limited to:  - adverse reactions to  medications - damage to eyes, teeth, lips or other oral mucosa - nerve damage due to positioning  - sore throat or hoarseness - Damage to heart, brain, nerves, lungs, other parts of body or loss of life  Patient voiced understanding and assent.)       Anesthesia Quick Evaluation

## 2023-12-04 NOTE — Interval H&P Note (Signed)
History and Physical Interval Note:  12/04/2023 7:16 AM  Debra Cline  has presented today for surgery, with the diagnosis of Hypertrophy of tonsils Tonsillolithiasis.  The various methods of treatment have been discussed with the patient and family. After consideration of risks, benefits and other options for treatment, the patient has consented to  Procedure(s): TONSILLECTOMY (Bilateral) as a surgical intervention.  The patient's history has been reviewed, patient examined, no change in status, stable for surgery.  I have reviewed the patient's chart and labs.  Questions were answered to the patient's satisfaction.     Reola Mosher S  No changes to H&P.   Magnus Ivan. Okey Dupre, MD, MBA, [facility] [phone]

## 2023-12-04 NOTE — Transfer of Care (Signed)
Immediate Anesthesia Transfer of Care Note  Patient: Debra Cline  Procedure(s) Performed: TONSILLECTOMY (Bilateral)  Patient Location: PACU  Anesthesia Type: No value filed.  Level of Consciousness: awake, alert  and patient cooperative  Airway and Oxygen Therapy: Patient Spontanous Breathing and Patient connected to supplemental oxygen  Post-op Assessment: Post-op Vital signs reviewed, Patient's Cardiovascular Status Stable, Respiratory Function Stable, Patent Airway and No signs of Nausea or vomiting  Post-op Vital Signs: Reviewed and stable  Complications: No notable events documented.

## 2023-12-04 NOTE — Op Note (Signed)
OPERATIVE REPORT   Attending Physician: Magnus Ivan. Okey Dupre, MD, MBA, FARS    Otolaryngology-Head & Neck Surgery    Preoperative Diagnosis: Chronic / recurrent tonsillitis; tonsolithiasis. Postoperative Diagnosis: Same.    Procedure(s) Performed:  Bilateral Tonsillectomy, CPT [id number]  Teaching Surgeon: Magnus Ivan. Okey Dupre, MD, MBA, FARS Assistants: None Dictated   Anesthesia: General  Specimens: Bilateral tonsillar tissue Drains:  None. Estimated Blood Loss: Less than 10 mL.  Operative Findings:  3+ tonsil and adenoid Hypertrophy.  Procedure:  After informed consent was obtained, the patient was brought from the preoperative holding area to the operating room and placed supine on the operating room table. After smooth induction of general anesthesia, a timeout was performed and all parties were in agreement.   Attention was then turned to the tonsillectomy porion of the procedure.  The patient was turned 90 degrees away from anesthesia and suspended in the Jerrica Thorman position using a tongue retractor and mouth gag, gently on the Mayo stand.  The palate was noted to be without submucosal cleft or bifid uvula.  A Foley catheter was then placed through the right nare and tied around the soft palate allowing visualization of the nasopharynx.  Adenoids were noted to be within normal limits.  The tonsils were then excised bilaterally using bovie electrocautery without difficulty and bipolar as well for hemostasis throughout.  The oral cavity, oropharynx and nasopharynx were then copiously irrigated with sterile saline.  Adequate hemostasis was again assured and the tongue retractor and mouth guard removed.  The stomach was suctioned, and the patient turned 90 degrees back towards anesthesia.   The patient's care was turned over to the Anesthesia team who successfully awakened the patient without event. The patient was transported to the PACU in stable condition. All instrument, sharp and lap counts were correct  at the end of the case.   Teaching Surgeon Attestation:  I was present and performed the entire procedure.   Magnus Ivan. Okey Dupre, MD, MBA, [facility] [phone]

## 2023-12-04 NOTE — H&P (Signed)
HPI: This is a 27 year old female who: is being seen for a chief complaint of tonsillolithiasis. The tonsil stones are located on the right tonsil. She has tonsilloliths that are asymptomatic. She has tonsilloliths that are improving and moderate in severity. She has associated hoarseness (mild), recurrent infections, sleep apnea, snoring, and ear fullness bilateral , but no difficulty swallowing. The tonsilloliths have been present for 3 weeks. The symptoms developed suddenly. Pertinent history includes: recurrent tonsillitis 4 episodes of tonsillitis in the last year. She has had the following treatment(s): nothing. Patient states that she has had a recurrent issue with tonsil stones since she was a little kid. Patiently recently completed a round of amoxicillin which helped. The history was felt to be reliable. The patient provided translation services. ---- Debra Cline is a very pleasant 27 yo woman with history of chronic tonsillitis, recurrent sore throats and chronic tonsolithiasis. She is otherwise very healthy. NKDA. PSH - C-section without complication. 1. Vitals: Date Taken By B.P. Pulse Resp. O2 Sat. Temp. Ht. Wt. BMI BSA 11/05/23 14:54 Dyane Dustman, Cristopher 127/69 SIT 67 97.3 F 66.0 in 169.0 lbs 27.3 1.9 FiO2 * Patient Reported Exam: An Otolaryngologic exam was performed Otolaryngologic exam External Ears: external ear examination of normal size and morphology without traumatic or congenital deformity AD, external ear examination of normal size and morphology without traumatic or congenital deformity AS. External ear canal AD: Normal EAC exam External ear canal AS: Normal EAC exam Tympanic membrane AD: AD tympanic membrane intact, no fluid, normal mobility on pneumotoscopy Tympanic membrane AS: AS tympanic membrane intact, no fluid, normal mobility on pneumotoscopy External Nose: Nasal dorsum midline Right Nasal Cavity: right intranasal examination normal  without turbinate hypertrophy, masses, septal deformity or synechiae Left nasal cavity: left intranasal examination normal without turbinate hypertrophy, masses, septal deformity or synechiae Lips, Teeth, Gums: normal lip morphology and anatomy, class I occlusion, no dental abnormalities Visit Note - November 05, 2023 Debra Cline, Debra Cline MRN: [id number] DOB: 08/02/1996 Sex: Female PMS ID: [id number] Adron Bene (Primary Provider) Annette Stable Under) Page 2 [phone] Work [phone] Fax [facility] [place] [address] [place], [place] [address] Oral cavity/Oropharynx:cryptic tonsils, bilateral and tonsil hypertrophy, 3+ bilateral Mild to moderate tongue tie on exam, but no hx of significant sx, tonsil hypertrophy 3+ and crypts The remainder of the oral cavity and oropharyngeal exam (buccal mucosa, tongue, floor of mouth, hard and soft palates, tonsils, posterior and lateral pharyngeal walls) is normal with the exception of the above findings. Head Inspection: Normal head inspection with normal head shape, without masses or concerning lesions.  Head Palpation: Normal head inspection without masses, palpable deformities, or concerning lesions. Salivary: No palpable salivary gland masses - no erythema or tenderness. Facial nerve intact and symmetric bilaterally. Neck: normal neck examination without skin masses, tenderness or crepitus  Thyroid: normal thyroid examination without masses or nodules Respiratory Effort: normal respiratory effort without labored breathing or accessory muscle use  Neck Lymph Node: normal lymphatic exam without lymphadenopathy in cranial or cervical regions Neuro - Cranial Nerves: Cranial nerves II-XII intact.  Chest - clear to auscultation bilaterally C/V - regular rate and rhythm without murmur    Appearance: Small for age of 28 mos, but appears alert - can support head. No stridor or airway distress. Communication:  normal vocal quality and ability to communicate Orientation: Alert and oriented to person, place, time. 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 Please refer to the education handout for detailed counseling. Plan: Treatment Regimen. Begin the following treatments: I discussed with patient the risk and benefits of tonsillectomy. I made her aware that I would not remove adenoids. Currently on exam her right tonsil is bigger than her left tonsil.. Plan: Order for Surgery. Surgery scheduling order Surgeon: Reola Mosher Procedure(s): tonsillectomy over age of 46; CPT: 2 1. Visit Note - November 05, 2023 Debra Cline, Debra Cline MRN: [id number] DOB: Jan 14, 1996 Sex: Female PMS ID: [id number] Adron Bene (Primary Provider) [facility] Under) Page [phone] Work [phone] Fax [facility] [place] [address] [place], [place] [address] Estimated Time: 45 minutes. Post-op follow up in: 28 days Facility Needed: [facility] Diagnosis codes: Hypertrophy of tonsils J35.1 Additional diagnosis codes: Hypertrophy of tonsils, ICD-10: J35.1 Anesthesia: general. Admission Status: outpatient. 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 Begin the following treatments: ** Recommend to OR for bilateral tonsillectomy. We discussed adding possible frenulectomy due to incidental finding of tongue tie on exam, but she'd like to hold off on this for now. Anticipate any Debra Cline OR location. RTC - Dr. Okey Dupre at [facility] office 3-4 weeks postop.. Care Regimen: Discussed the risks, benefits, and options of this procedure with the  patient / family today - they understand and agree to proceed. The patient / family does have all of our contact information if needed. 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 I counseled the patient regarding the following: Tongue Anomaly Care: Anomalies of the tongue may include underdevelopment, adhesion to the floor of the mouth, fissures, plaques and other conditions. They may present emergently in infancy if airway obstruction occurs. Feeding and speech may also be impaired. Nutritional and respiratory support may be required, and surgery is indicated for many such anomalies. Expectations: If normal feeding is impaired, nutritional support and surgical intervention may be necessary early. There may be slow return to normal function after surgical intervention. Speech development may require surgical intervention as well as speech therapy. Contact office if: the patient develops concerning symptoms like fever, redness, warmth, or has difficulty with feeding or breathing. Plan: Treatment Regimen. Begin the following treatments: I discussed the risk and benefits of surgery with patient. Patient at this time is not interested in surgery for tongue tie.. 3. 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 Okey Dupre, MD, MBA, [facility] Otolaryngology-Head & Neck Surgery [facility] [phone]

## 2023-12-04 NOTE — Anesthesia Postprocedure Evaluation (Signed)
Anesthesia Post Note  Patient: Debra Cline  Procedure(s) Performed: TONSILLECTOMY (Bilateral)  Patient location during evaluation: PACU Anesthesia Type: General Level of consciousness: awake and alert Pain management: pain level controlled Vital Signs Assessment: post-procedure vital signs reviewed and stable Respiratory status: spontaneous breathing, nonlabored ventilation, respiratory function stable and patient connected to nasal cannula oxygen Cardiovascular status: blood pressure returned to baseline and stable Postop Assessment: no apparent nausea or vomiting Anesthetic complications: no   No notable events documented.   Last Vitals:  Vitals:   12/04/23 0940 12/04/23 0945  BP:  (!) 144/96  Pulse: 82 82  Resp: 15 12  Temp:  36.6 C  SpO2: 100% 100%    Last Pain:  Vitals:   12/04/23 0945  TempSrc:   PainSc: 9                  Brinden Kincheloe C Emilio Baylock

## 2023-12-05 ENCOUNTER — Encounter: Payer: Self-pay | Admitting: Otolaryngology

## 2023-12-08 LAB — SURGICAL PATHOLOGY

## 2024-08-29 DIAGNOSIS — R079 Chest pain, unspecified: Secondary | ICD-10-CM | POA: Insufficient documentation

## 2024-08-29 NOTE — Progress Notes (Unsigned)
 Cardiology Office Note  Date:  08/30/2024   ID:  Debra Cline, DOB 05/08/1996, MRN [id number]  PCP:  [facility]   Chief Complaint  Patient presents with   New Patient (Initial Visit)    Patient c/o chest pain that comes and goes.     HPI:  Debra Cline a 28 y.o. femalewith past medical history of: Adjustment disorder with anxiety Chronic chest pain dating back to 2018 Chronic dizziness/benign positional vertigo Who presents by referral from Dr. Christinia Molt (cardiologist out of [facility]) for consultation of her chest pain  June 02, 2024 had dizziness, chest pain felt like she was going to pass out She called 911 taken to [facility] ER In the ER at [facility] June 02, 2024 dizziness Had diarrhea several days, nausea vomiting, left side abdominal pain, lightheadedness, chest pain Cardiac workup negative EKG normal CT abdomen negative  Since then seen by primary care, started on PPI, regiment for constipation Started on scopolamine  patch for dizziness Reported chest pain better on Nexium  Tonsils out 12/24, since that time his had chronic episodes of spinning/dizziness Sometimes has symptoms when standing up  Loud noises triggers dizziness/laying Can happen sitting or laying down [employer], made it worse  Chronic chest pain dating back to when she was in college 2018 Feels like Something sitting on chest all the time Or symptoms over the past summer, stress over the summer, son with father,restarting work Postpardum issues Was taking sublingual nitro with improvement of her pain as needed but ran out of nitro  Chest pain started in college, 2018 Shooting sharp pain all the time Seen by cardiology in [place], had holter,  Pain lasting 1 hr,  For treatment she would try warm bath NTG helps but does not like head pressure, energy high Having CP 1-2 x a week  Labs reviewed Total cholesterol 183 LDL 86 A1c 5.4  EKG personally reviewed by myself on todays  visit EKG Interpretation Date/Time:  Monday August 30 2024 08:54:14 EDT Ventricular Rate:  73 PR Interval:  154 QRS Duration:  88 QT Interval:  386 QTC Calculation: 425 R Axis:   70  Text Interpretation: Normal sinus rhythm with sinus arrhythmia Normal ECG When compared with ECG of 09-Dec-2019 20:47, No significant change was found Confirmed by Perla Lye [phone]) on 08/30/2024 9:00:44 AM    PMH:   has a past medical history of Medical history non-contributory and PONV (postoperative nausea and vomiting).  PSH:    Past Surgical History:  Procedure Laterality Date   CESAREAN SECTION     TONSILLECTOMY Bilateral 12/04/2023   Procedure: TONSILLECTOMY;  Surgeon: Rumalda Massie RAMAN, MD;  [facility];  Service: ENT;  Laterality: Bilateral;    Current Outpatient Medications  Medication Sig Dispense Refill   albuterol (VENTOLIN HFA) 108 (90 Base) MCG/ACT inhaler Inhale 2 puffs into the lungs every 4 (four) hours as needed.     etonogestrel (NEXPLANON) 68 MG IMPL implant Inject 1 each into the skin once.     isosorbide  mononitrate (IMDUR ) 30 MG 24 hr tablet Take 1 tablet (30 mg total) by mouth daily. 90 tablet 3   meloxicam (MOBIC) 15 MG tablet Take 15 mg by mouth daily.     nitroGLYCERIN (NITROSTAT) 0.4 MG SL tablet Place 0.4 mg under the tongue every 5 (five) minutes as needed.     oxyCODONE  (ROXICODONE ) 5 MG/5ML solution Take 5 mLs (5 mg total) by mouth every 4 (four) hours as needed for up  to 25 doses for severe pain (pain score 7-10). (Patient not taking: Reported on 08/30/2024) 125 mL 0   No current facility-administered medications for this visit.     Allergies:   Bacid, Candida albicans, Peanut oil, Peanut-containing drug products, Shellfish-derived products, Tilactase, Trichophyton, Molds & smuts, and Yeast   Social History:  The patient  reports that she has never smoked. She has never used smokeless tobacco. She reports that she does not drink alcohol and does  not use drugs.   Family History:   Family history is unknown by patient.    Review of Systems: Review of Systems  Constitutional: Negative.   HENT: Negative.    Respiratory: Negative.    Cardiovascular:  Positive for chest pain.  Gastrointestinal: Negative.   Musculoskeletal: Negative.   Neurological:  Positive for dizziness.  Psychiatric/Behavioral: Negative.    All other systems reviewed and are negative.   PHYSICAL EXAM: VS:  BP 108/80 (BP Location: Right Arm, Patient Position: Sitting, Cuff Size: Normal)   Pulse 73   Ht 5' 6 (1.676 m)   Wt 163 lb 8 oz (74.2 kg)   SpO2 99%   BMI 26.39 kg/m  , BMI Body mass index is 26.39 kg/m. GEN: Well nourished, well developed, in no acute distress HEENT: normal Neck: no JVD, carotid bruits, or masses Cardiac: RRR; no murmurs, rubs, or gallops,no edema  Respiratory:  clear to auscultation bilaterally, normal work of breathing GI: soft, nontender, nondistended, + BS MS: no deformity or atrophy Skin: warm and dry, no rash Neuro:  Strength and sensation are intact Psych: euthymic mood, full affect   Recent Labs: No results found for requested labs within last 365 days.    Lipid Panel No results found for: CHOL, HDL, LDLCALC, TRIG    Wt Readings from Last 3 Encounters:  08/30/24 163 lb 8 oz (74.2 kg)  12/04/23 170 lb 1.6 oz (77.2 kg)  10/25/23 163 lb (73.9 kg)     ASSESSMENT AND PLAN:  Problem List Items Addressed This Visit     Chest pain of uncertain etiology - Primary   Relevant Orders   EKG 12-Lead (Completed)   ECHOCARDIOGRAM COMPLETE   Other Visit Diagnoses       Benign paroxysmal positional vertigo, unspecified laterality         Anxiety          Chronic chest pain Unable to exclude Prinzmetal's angina Dating back to 2018 when she was in college,  Nitro seems to help but gives her a headache, rush sensation.  Has not tried other medications -No prior echocardiogram available, we have ordered  echo to rule out structural heart disease Prior Zio monitor was uneventful -Recommend she try isosorbide  mononitrate 15 mg for several weeks before titrating up to 30 mg daily if needed for symptom relief Okay to take sublingual nitro with her isosorbide  Other options would be isosorbide  dinitrate or Ranexa  Dizziness Consistent with benign positional vertigo given symptoms can happen when laying down, sitting, with head movement Seem to happen when she had her tonsils out Recommend she follow-up with the ENT who did her tonsillectomy and see if they have vestibular PT She reports meclizine does not help, makes her symptoms worse  Ms. Paulette was seen in consultation for Dr. Christinia Molt and primary care and will be referred back to his office for ongoing care of the issues detailed above   Signed, Velinda Lunger, M.D., Ph.D. [facility] [place], [place] [phone]

## 2024-08-30 ENCOUNTER — Encounter: Payer: Self-pay | Admitting: Cardiovascular Disease

## 2024-08-30 ENCOUNTER — Ambulatory Visit: Attending: Cardiovascular Disease | Admitting: Cardiovascular Disease

## 2024-08-30 VITALS — BP 108/80 | HR 73 | Ht 66.0 in | Wt 163.5 lb

## 2024-08-30 DIAGNOSIS — H8110 Benign paroxysmal vertigo, unspecified ear: Secondary | ICD-10-CM | POA: Diagnosis not present

## 2024-08-30 DIAGNOSIS — F419 Anxiety disorder, unspecified: Secondary | ICD-10-CM | POA: Diagnosis not present

## 2024-08-30 DIAGNOSIS — R079 Chest pain, unspecified: Secondary | ICD-10-CM

## 2024-08-30 MED ORDER — ISOSORBIDE MONONITRATE ER 30 MG PO TB24: 30.0000 mg | ORAL_TABLET | Freq: Every day | ORAL | 3 refills | Status: AC

## 2024-08-30 NOTE — Patient Instructions (Addendum)
 Medication Instructions:   Please start isosorbide  mononitrate (imdur ) 30 mg daily (Start 1/2 pill first)  If you need a refill on your cardiac medications before your next appointment, please call your pharmacy.   Lab work: No new labs needed  Testing/Procedures:  Your physician has requested that you have an echocardiogram. Echocardiography is a painless test that uses sound waves to create images of your heart. It provides your doctor with information about the size and shape of your heart and how well your heart's chambers and valves are working.   You may receive an ultrasound enhancing agent through an IV if needed to better visualize your heart during the echo. This procedure takes approximately one hour.  There are no restrictions for this procedure.  This will take place at [address] [facility] [address], [place] [address]  Please note: We ask at that you not bring children with you during ultrasound (echo/ vascular) testing. Due to room size and safety concerns, children are not allowed in the ultrasound rooms during exams. Our front office staff cannot provide observation of children in our lobby area while testing is being conducted. An adult accompanying a patient to their appointment will only be allowed in the ultrasound room at the discretion of the ultrasound technician under special circumstances. We apologize for any inconvenience.   Follow-Up: At [facility], you and your health needs are our priority.  As part of our continuing mission to provide you with exceptional heart care, we have created designated Provider Care Teams.  These Care Teams include your primary Cardiologist (physician) and Advanced Practice Providers (APPs -  Physician Assistants and Nurse Practitioners) who all work together to provide you with the care you need, when you need it.  You will need a follow up appointment as needed  Providers on your designated Care Team:    Lonni Meager, NP Bernardino Bring, PA-C Cadence Franc[place]ster, [place]  COVID-19 Vaccine Information can be found at: [email or web address] For questions related to vaccine distribution or appointments, please email [email or web address] or call [phone].

## 2024-09-15 ENCOUNTER — Encounter: Payer: Self-pay | Admitting: Cardiovascular Disease

## 2024-09-23 ENCOUNTER — Encounter: Payer: Self-pay | Admitting: Cardiovascular Disease

## 2024-10-04 ENCOUNTER — Ambulatory Visit: Attending: Cardiovascular Disease

## 2024-10-04 DIAGNOSIS — R079 Chest pain, unspecified: Secondary | ICD-10-CM | POA: Diagnosis not present

## 2024-10-04 LAB — ECHOCARDIOGRAM COMPLETE
AR max vel: 2.66 cm2
AV Area VTI: 2.46 cm2
AV Area mean vel: 2.5 cm2
AV Mean grad: 3.5 mmHg
AV Peak grad: 6.4 mmHg
Ao pk vel: 1.27 m/s
Area-P 1/2: 3.6 cm2
S' Lateral: 3.1 cm

## 2024-10-09 ENCOUNTER — Ambulatory Visit: Payer: Self-pay | Admitting: Cardiovascular Disease

## 2024-11-12 DIAGNOSIS — S0990XA Unspecified injury of head, initial encounter: Secondary | ICD-10-CM | POA: Diagnosis present

## 2024-11-12 DIAGNOSIS — W228XXA Striking against or struck by other objects, initial encounter: Secondary | ICD-10-CM | POA: Insufficient documentation

## 2024-11-13 ENCOUNTER — Emergency Department

## 2024-11-13 ENCOUNTER — Emergency Department
Admission: EM | Admit: 2024-11-13 | Discharge: 2024-11-13 | Disposition: A | Discharge status: Home / Self Care | Attending: Emergency Medicine | Admitting: Emergency Medicine

## 2024-11-13 ENCOUNTER — Other Ambulatory Visit: Payer: Self-pay

## 2024-11-13 DIAGNOSIS — S0990XA Unspecified injury of head, initial encounter: Secondary | ICD-10-CM

## 2024-11-13 LAB — COMPREHENSIVE METABOLIC PANEL WITH GFR
ALT: 11 U/L (ref 0–44)
AST: 15 U/L (ref 15–41)
Albumin: 4.6 g/dL (ref 3.5–5.0)
Alkaline Phosphatase: 94 U/L (ref 38–126)
Anion gap: 9 (ref 5–15)
BUN: 8 mg/dL (ref 6–20)
CO2: 28 mmol/L (ref 22–32)
Calcium: 9 mg/dL (ref 8.9–10.3)
Chloride: 106 mmol/L (ref 98–111)
Creatinine, Ser: 0.71 mg/dL (ref 0.44–1.00)
GFR, Estimated: >60 mL/min (ref >60)
Glucose, Bld: 76 mg/dL (ref 70–99)
Potassium: 3.6 mmol/L (ref 3.5–5.1)
Sodium: 142 mmol/L (ref 135–145)
Total Bilirubin: 0.3 mg/dL (ref 0.0–1.2)
Total Protein: 7.7 g/dL (ref 6.5–8.1)

## 2024-11-13 LAB — CBC
HCT: 38.9 % (ref 36.0–46.0)
Hemoglobin: 12.1 g/dL (ref 12.0–15.0)
MCH: 23.2 pg — ABNORMAL LOW (ref 26.0–34.0)
MCHC: 31.1 g/dL (ref 30.0–36.0)
MCV: 74.7 fL — ABNORMAL LOW (ref 80.0–100.0)
Platelets: 350 K/uL (ref 150–400)
RBC: 5.21 MIL/uL — ABNORMAL HIGH (ref 3.87–5.11)
RDW: 14.2 % (ref 11.5–15.5)
WBC: 10.1 K/uL (ref 4.0–10.5)
nRBC: 0 % (ref 0.0–0.2)

## 2024-11-13 NOTE — ED Triage Notes (Signed)
 Pt presents for head injury. Struck top/back of head on a TV last night. No LOC. All of today has had a headache. Now endorsing dizziness and feeling off balance. Endorsing nausea without vomiting.

## 2024-11-13 NOTE — ED Provider Notes (Signed)
 [facility] Provider Note    Event Date/Time   First MD Initiated Contact with Patient 11/13/24 0037     (approximate)   History   Head Injury   HPI  Debra Cline is a 28 y.o. female   Past medical history of no significant medical history who presents to Emergency Department with a head injury sustained last night.  He was reaching down for something stood up and hit the top of her head on the TV.  No loss of consciousness no blood thinners.  Has had quite severe headache and some dizziness and nausea without vomiting since then.  No neck pain, chronic neck pain is unchanged since his injury.  No neurologic changes.  No other acute medical complaints.    External Medical Documents Reviewed: Previous outpatient notes      Physical Exam   Triage Vital Signs: ED Triage Vitals [11/13/24 0007]  Encounter Vitals Group     BP (!) 150/86     Girls Systolic BP Percentile      Girls Diastolic BP Percentile      Boys Systolic BP Percentile      Boys Diastolic BP Percentile      Pulse Rate 69     Resp 18     Temp 98.3 F (36.8 C)     Temp Source Oral     SpO2 100 %     Weight 164 lb (74.4 kg)     Height 5' 6 (1.676 m)     Head Circumference      Peak Flow      Pain Score 8     Pain Loc      Pain Education      Exclude from Growth Chart     Most recent vital signs: Vitals:   11/13/24 0007  BP: (!) 150/86  Pulse: 69  Resp: 18  Temp: 98.3 F (36.8 C)  SpO2: 100%    General: Awake, no distress.  CV:  Good peripheral perfusion.  Resp:  Normal effort.  Abd:  No distention.  Other:  Pleasant woman in no acute distress slightly hypertensive otherwise vital signs are normal.  No signs of trauma to the head on external exam.  Neck supple full range of motion no midline tenderness or deformity.  Head to toe examination reveals no other traumatic injuries.  Walking with normal gait.   ED Results / Procedures / Treatments   Labs (all  labs ordered are listed, but only abnormal results are displayed) Labs Reviewed  CBC - Abnormal; Notable for the following components:      Result Value   RBC 5.21 (*)    MCV 74.7 (*)    MCH 23.2 (*)    All other components within normal limits  COMPREHENSIVE METABOLIC PANEL WITH GFR     I ordered and reviewed the above labs they are notable for cell counts and electrolytes largely unremarkable.   RADIOLOGY I independently reviewed and interpreted CT head and see no obvious bleeding or midline shift I also reviewed radiologist's formal read.   PROCEDURES:  Critical Care performed: No  Procedures   MEDICATIONS ORDERED IN ED: Medications - No data to display   IMPRESSION / MDM / ASSESSMENT AND PLAN / ED COURSE  I reviewed the triage vital signs and the nursing notes.  Patient's presentation is most consistent with acute presentation with potential threat to life or bodily function.  Differential diagnosis includes, but is not limited to, concussion, head injury, neck injury, ICH skull fracture   MDM:    Well-appearing young woman with head injury with some associated symptoms that may represent ICH versus more likely concussion.  Fortunately CT head negative.  Otherwise traumatic exam unremarkable neurologic exam unremarkable, doubt dissection or other traumatic injuries.  Anticipatory guidance given, discharge.       FINAL CLINICAL IMPRESSION(S) / ED DIAGNOSES   Final diagnoses:  Injury of head, initial encounter     Rx / DC Orders   ED Discharge Orders     None        Note:  This document was prepared using Dragon voice recognition software and may include unintentional dictation errors.    Cyrena Mylar, MD 11/13/24 GARLON

## 2024-11-13 NOTE — Discharge Instructions (Signed)
 Take acetaminophen 650 mg and ibuprofen 400 mg every 6 hours for pain.  Take with food.  Thank you for choosing [place] for your health care today!  Please see your primary doctor this week for a follow up appointment.   If you have any new, worsening, or unexpected symptoms call your doctor right away or come back to the emergency department for reevaluation.  It was my pleasure to care for you today.   Daneil Dan Modesto Charon, MD

## 2024-11-14 ENCOUNTER — Other Ambulatory Visit: Payer: Self-pay

## 2024-11-14 ENCOUNTER — Emergency Department: Admission: EM | Admit: 2024-11-14 | Discharge: 2024-11-14 | Disposition: A | Discharge status: Home / Self Care

## 2024-11-14 DIAGNOSIS — S060XAA Concussion with loss of consciousness status unknown, initial encounter: Secondary | ICD-10-CM | POA: Diagnosis not present

## 2024-11-14 DIAGNOSIS — S0990XA Unspecified injury of head, initial encounter: Secondary | ICD-10-CM | POA: Diagnosis present

## 2024-11-14 DIAGNOSIS — X58XXXA Exposure to other specified factors, initial encounter: Secondary | ICD-10-CM | POA: Diagnosis not present

## 2024-11-14 LAB — COMPREHENSIVE METABOLIC PANEL WITH GFR
ALT: 10 U/L (ref 0–44)
AST: 13 U/L — ABNORMAL LOW (ref 15–41)
Albumin: 4.4 g/dL (ref 3.5–5.0)
Alkaline Phosphatase: 87 U/L (ref 38–126)
Anion gap: 9 (ref 5–15)
BUN: 8 mg/dL (ref 6–20)
CO2: 26 mmol/L (ref 22–32)
Calcium: 9.2 mg/dL (ref 8.9–10.3)
Chloride: 103 mmol/L (ref 98–111)
Creatinine, Ser: 0.74 mg/dL (ref 0.44–1.00)
GFR, Estimated: >60 mL/min (ref >60)
Glucose, Bld: 85 mg/dL (ref 70–99)
Potassium: 3.8 mmol/L (ref 3.5–5.1)
Sodium: 139 mmol/L (ref 135–145)
Total Bilirubin: 0.4 mg/dL (ref 0.0–1.2)
Total Protein: 7.2 g/dL (ref 6.5–8.1)

## 2024-11-14 LAB — CBC
HCT: 38.8 % (ref 36.0–46.0)
Hemoglobin: 12.2 g/dL (ref 12.0–15.0)
MCH: 23.4 pg — ABNORMAL LOW (ref 26.0–34.0)
MCHC: 31.4 g/dL (ref 30.0–36.0)
MCV: 74.5 fL — ABNORMAL LOW (ref 80.0–100.0)
Platelets: 326 K/uL (ref 150–400)
RBC: 5.21 MIL/uL — ABNORMAL HIGH (ref 3.87–5.11)
RDW: 14.2 % (ref 11.5–15.5)
WBC: 9.3 K/uL (ref 4.0–10.5)
nRBC: 0 % (ref 0.0–0.2)

## 2024-11-14 LAB — LIPASE, BLOOD: Lipase: 21 U/L (ref 11–51)

## 2024-11-14 MED ORDER — ONDANSETRON 4 MG PO TBDP: 4.0000 mg | ORAL_TABLET | Freq: Three times a day (TID) | ORAL | 0 refills | Status: AC | PRN

## 2024-11-14 MED ORDER — ONDANSETRON 4 MG PO TBDP
4.0000 mg | ORAL_TABLET | Freq: Once | ORAL | Status: AC
  Administered 2024-11-14: 4 mg via ORAL
  Filled 2024-11-14: qty 1

## 2024-11-14 NOTE — Discharge Instructions (Addendum)
 Based on your symptoms today you may have a concussion. This is a clinical diagnosis which means there is no diagnostic test to confirm this. Symptoms of a concussion include: headache, dizziness, insomnia, fatigue, uneven gait, nausea, vomiting, blurred vision and difficulty concentrating. These symptoms may develop and change overtime. Most people have symptom resolution in 7-10 days but it may take up to two weeks to a month. If you are still having symptoms after 2 weeks please be evaluated by another healthcare provider. This could be your primary care provider, urgent care or the emergency department. You should take it easy for the 24-48 hours.  Avoid activities that require a lot of thinking or physical effort.  After a few days gradually return to normal activities as long as they do not make symptoms worse.  If your symptoms return or get worse, slow down and rest more.  Avoid activities that could lead to another head injury until cleared by a healthcare provider.  Do not drive, ride a bike or operate heavy machinery until you feel fully alert. Return to the ED if you have repeat vomiting, severe or worsening headache that does not go away with pain medication, trouble waking up, staying awake or unusual drowsiness, slurred speech or trouble speaking, weakness/numbness or trouble moving arms or legs, seizure, unequal pupil sizes or clear fluid or blood coming from the nose or ears.   I have sent some nausea medication called Zofran  to the pharmacy.  This can be taken every 8 hours as needed.

## 2024-11-14 NOTE — ED Triage Notes (Signed)
 Pt was dx with concussion yesterday, pt reports her nausea has gotten worse and so has her headache.

## 2024-11-14 NOTE — ED Provider Notes (Signed)
 [facility] Provider Note    Event Date/Time   First MD Initiated Contact with Patient 11/14/24 2018     (approximate)   History   Head Injury   HPI  Debra Cline is a 28 y.o. female with no PMH who presents for evaluation of a head injury.  Patient hit her head on Thursday and was evaluated in the emergency department on Friday night.  She had a negative CT scan at that time and was diagnosed with a concussion.  Patient returns to the emergency department tonight because that she had multiple episodes of vomiting which was one of the return precautions she was given.  Patient also endorses blurry vision in her right eye.      Physical Exam   Triage Vital Signs: ED Triage Vitals  Encounter Vitals Group     BP 11/14/24 1950 (!) 141/85     Girls Systolic BP Percentile --      Girls Diastolic BP Percentile --      Boys Systolic BP Percentile --      Boys Diastolic BP Percentile --      Pulse Rate 11/14/24 1950 63     Resp 11/14/24 1950 17     Temp 11/14/24 1950 98.6 F (37 C)     Temp src --      SpO2 11/14/24 1950 100 %     Weight 11/14/24 1949 164 lb (74.4 kg)     Height 11/14/24 1949 5' 6 (1.676 m)     Head Circumference --      Peak Flow --      Pain Score 11/14/24 1949 8     Pain Loc --      Pain Education --      Exclude from Growth Chart --     Most recent vital signs: Vitals:   11/14/24 1950  BP: (!) 141/85  Pulse: 63  Resp: 17  Temp: 98.6 F (37 C)  SpO2: 100%   General: Awake, no distress.  CV:  Good peripheral perfusion.  RRR. Resp:  Normal effort.  CTAB. Abd:  No distention.  Other:  PERRL, EOM intact, no ataxia, negative pronator drift, symmetric facial movements.   ED Results / Procedures / Treatments   Labs (all labs ordered are listed, but only abnormal results are displayed) Labs Reviewed  COMPREHENSIVE METABOLIC PANEL WITH GFR - Abnormal; Notable for the following components:      Result Value   AST  13 (*)    All other components within normal limits  CBC - Abnormal; Notable for the following components:   RBC 5.21 (*)    MCV 74.5 (*)    MCH 23.4 (*)    All other components within normal limits  LIPASE, BLOOD  URINALYSIS, ROUTINE W REFLEX MICROSCOPIC  POC URINE PREG, ED    PROCEDURES:  Critical Care performed: No  Procedures   MEDICATIONS ORDERED IN ED: Medications  ondansetron  (ZOFRAN -ODT) disintegrating tablet 4 mg (4 mg Oral Given 11/14/24 2045)     IMPRESSION / MDM / ASSESSMENT AND PLAN / ED COURSE  I reviewed the triage vital signs and the nursing notes.                             28 year old female presents for evaluation of a head injury.  Vital signs are stable aside from elevated blood pressure.  Patient NAD on exam.  Differential diagnosis  includes, but is not limited to, concussion, intracranial bleed, skull fracture, viral gastroenteritis, pancreatitis, biliary disease.  Patient's presentation is most consistent with acute complicated illness / injury requiring diagnostic workup.  Physical exam is reassuring.  I do not identify any focal neurodeficits.  I suspect that her symptoms are due to a concussion and feel that this is part of the normal progression.  Since she had a negative CT scan 2 days ago I have a very low suspicion for intracranial bleed and skull fracture.  Do not feel that the CT scan needs to be repeated today. I consulted with my attending who was in agreement. Blood work was obtained from triage, I assume to evaluate for other potential causes of the nausea.  CBC, CMP and lipase are all unremarkable.  Do not suspect abdominal cause of nausea like pancreatitis, gastroenteritis or biliary disease.  Feel that patient's nausea is related to her concussion.  Will send a prescription for nausea medication.  Advised patient to continue treating her symptoms with over-the-counter medications.  Discussed return precautions.  She voiced understanding,  all questions were answered and she was stable at discharge.     FINAL CLINICAL IMPRESSION(S) / ED DIAGNOSES   Final diagnoses:  Concussion with unknown loss of consciousness status, initial encounter     Rx / DC Orders   ED Discharge Orders          Ordered    ondansetron  (ZOFRAN -ODT) 4 MG disintegrating tablet  Every 8 hours PRN        11/14/24 2039             Note:  This document was prepared using Dragon voice recognition software and may include unintentional dictation errors.   Cleaster Tinnie LABOR, PA-C 11/14/24 2050    Nicholaus Rolland BRAVO, MD 11/15/24 0000

## 2024-11-14 NOTE — ED Notes (Signed)
 Pt notes feeling of swollen right eyelid and feeling of something draining on the right side of her head.

## 2024-11-30 DIAGNOSIS — M5451 Vertebrogenic low back pain: Secondary | ICD-10-CM | POA: Diagnosis not present

## 2024-11-30 DIAGNOSIS — M542 Cervicalgia: Secondary | ICD-10-CM | POA: Diagnosis not present

## 2024-12-06 DIAGNOSIS — M542 Cervicalgia: Secondary | ICD-10-CM | POA: Diagnosis not present

## 2024-12-06 DIAGNOSIS — M5451 Vertebrogenic low back pain: Secondary | ICD-10-CM | POA: Diagnosis not present

## 2024-12-09 DIAGNOSIS — M542 Cervicalgia: Secondary | ICD-10-CM | POA: Diagnosis not present

## 2024-12-09 DIAGNOSIS — M5451 Vertebrogenic low back pain: Secondary | ICD-10-CM | POA: Diagnosis not present

## 2024-12-20 DIAGNOSIS — M5451 Vertebrogenic low back pain: Secondary | ICD-10-CM | POA: Diagnosis not present

## 2024-12-20 DIAGNOSIS — M542 Cervicalgia: Secondary | ICD-10-CM | POA: Diagnosis not present

## 2024-12-22 DIAGNOSIS — M542 Cervicalgia: Secondary | ICD-10-CM | POA: Diagnosis not present

## 2024-12-22 DIAGNOSIS — M5451 Vertebrogenic low back pain: Secondary | ICD-10-CM | POA: Diagnosis not present
